# Patient Record
Sex: Male | Born: 1995
Health system: Southern US, Community
[De-identification: ages and names within clinical notes are randomized; demographics above are authoritative.]

## PROBLEM LIST (undated history)

## (undated) DIAGNOSIS — T7840XA Allergy, unspecified, initial encounter: Secondary | ICD-10-CM

## (undated) DIAGNOSIS — L309 Dermatitis, unspecified: Secondary | ICD-10-CM

## (undated) DIAGNOSIS — J45990 Exercise induced bronchospasm: Secondary | ICD-10-CM

## (undated) HISTORY — DX: Exercise induced bronchospasm: J45.990

## (undated) HISTORY — DX: Dermatitis, unspecified: L30.9

## (undated) HISTORY — DX: Allergy, unspecified, initial encounter: T78.40XA

---

## 2021-10-27 ENCOUNTER — Other Ambulatory Visit: Payer: Self-pay

## 2021-10-27 ENCOUNTER — Encounter: Payer: Self-pay | Admitting: Emergency Medicine

## 2021-10-27 ENCOUNTER — Ambulatory Visit
Admission: EM | Admit: 2021-10-27 | Discharge: 2021-10-27 | Disposition: A | Discharge status: Home / Self Care | Payer: 59 | Attending: Emergency Medicine | Admitting: Emergency Medicine

## 2021-10-27 ENCOUNTER — Ambulatory Visit: Admit: 2021-10-27 | Discharge status: Absent without leave | Payer: Self-pay

## 2021-10-27 DIAGNOSIS — J069 Acute upper respiratory infection, unspecified: Secondary | ICD-10-CM

## 2021-10-27 DIAGNOSIS — Z20822 Contact with and (suspected) exposure to covid-19: Secondary | ICD-10-CM | POA: Insufficient documentation

## 2021-10-27 DIAGNOSIS — R059 Cough, unspecified: Secondary | ICD-10-CM | POA: Diagnosis present

## 2021-10-27 DIAGNOSIS — R110 Nausea: Secondary | ICD-10-CM | POA: Diagnosis present

## 2021-10-27 LAB — RESP PANEL BY RT-PCR (FLU A&B, COVID) ARPGX2
Influenza A by PCR: NEGATIVE
Influenza B by PCR: NEGATIVE
SARS Coronavirus 2 by RT PCR: NEGATIVE

## 2021-10-27 MED ORDER — IPRATROPIUM BROMIDE 0.06 % NA SOLN: 2.0000 | Freq: Four times a day (QID) | NASAL | 12 refills | Status: DC

## 2021-10-27 MED ORDER — PROMETHAZINE-DM 6.25-15 MG/5ML PO SYRP: 5.0000 mL | ORAL_SOLUTION | Freq: Four times a day (QID) | ORAL | 0 refills | Status: DC | PRN

## 2021-10-27 MED ORDER — BENZONATATE 100 MG PO CAPS: 200.0000 mg | ORAL_CAPSULE | Freq: Three times a day (TID) | ORAL | 0 refills | Status: DC

## 2021-10-27 NOTE — Discharge Instructions (Addendum)
Use the Atrovent nasal spray, 2 squirts in each nostril every 6 hours, as needed for runny nose and postnasal drip.  Use the Tessalon Perles every 8 hours during the day.  Take them with a small sip of water.  They may give you some numbness to the base of your tongue or a metallic taste in your mouth, this is normal.  Use the Promethazine DM cough syrup at bedtime for cough and congestion.  It will make you drowsy so do not take it during the day.  Use over-the-counter Tylenol ibuprofen according the package instructions needed for fever or pain.  Return for reevaluation or see your primary care provider for any new or worsening symptoms.

## 2021-10-27 NOTE — ED Triage Notes (Signed)
Patient c/o bodyaches, cough, congestion, HAs, and nausea that started last night.  Patient also reports fevers.

## 2021-10-27 NOTE — ED Provider Notes (Signed)
[facility]    CSN: [id number] Arrival date & time: 10/27/21  1117      History   Chief Complaint Chief Complaint  Patient presents with   Cough   Generalized Body Aches    HPI Cody Walsh is a 25 y.o. male.   HPI  25 year old male here for evaluation of flulike symptoms.  Patient ports that he has been experiencing headache, body aches, nasal congestion postnasal drip, fever with a T-max of 100.4 this morning, cough that is mostly productive in the a.m. and clears throughout the day, and nausea.  He has not had any nasal discharge, sore throat, ear pain, shortness breath or wheezing, vomiting, or diarrhea.  History reviewed. No pertinent past medical history.  There are no problems to display for this patient.   History reviewed. No pertinent surgical history.     Home Medications    Prior to Admission medications   Medication Sig Start Date End Date Taking? Authorizing Provider  benzonatate (TESSALON) 100 MG capsule Take 2 capsules (200 mg total) by mouth every 8 (eight) hours. 10/27/21  Yes Becky Augusta, NP  ipratropium (ATROVENT) 0.06 % nasal spray Place 2 sprays into both nostrils 4 (four) times daily. 10/27/21  Yes Becky Augusta, NP  loratadine (CLARITIN) 10 MG tablet Take 10 mg by mouth daily.   Yes [provider]  promethazine-dextromethorphan (PROMETHAZINE-DM) 6.25-15 MG/5ML syrup Take 5 mLs by mouth 4 (four) times daily as needed. 10/27/21  Yes Becky Augusta, NP    Family History History reviewed. No pertinent family history.  Social History Social History   Tobacco Use   Smoking status: Never   Smokeless tobacco: Never  Vaping Use   Vaping Use: Never used  Substance Use Topics   Alcohol use: Yes   Drug use: Never     Allergies   Sulfamethoxazole-trimethoprim   Review of Systems Review of Systems  Constitutional:  Positive for fever. Negative for activity change and appetite change.  HENT:  Positive for congestion  and postnasal drip. Negative for ear pain, rhinorrhea and sore throat.   Respiratory:  Positive for cough. Negative for shortness of breath and wheezing.   Gastrointestinal:  Negative for diarrhea, nausea and vomiting.  Musculoskeletal:  Positive for arthralgias and myalgias.  Skin:  Negative for rash.  Neurological:  Positive for headaches.  Hematological: Negative.   Psychiatric/Behavioral: Negative.      Physical Exam Triage Vital Signs ED Triage Vitals  Enc Vitals Group     BP 10/27/21 1257 129/70     Pulse Rate 10/27/21 1257 79     Resp 10/27/21 1257 15     Temp 10/27/21 1257 98.3 F (36.8 C)     Temp Source 10/27/21 1257 Oral     SpO2 10/27/21 1257 100 %     Weight 10/27/21 1254 250 lb (113.4 kg)     Height 10/27/21 1254 6\' 6"  (1.981 m)     Head Circumference --      Peak Flow --      Pain Score 10/27/21 1253 3     Pain Loc --      Pain Edu? --      Excl. in GC? --    No data found.  Updated Vital Signs BP 129/70 (BP Location: Left Arm)    Pulse 79    Temp 98.3 F (36.8 C) (Oral)    Resp 15    Ht 6\' 6"  (1.981 m)    Wt 250 lb (113.4  kg)    SpO2 100%    BMI 28.89 kg/m   Visual Acuity Right Eye Distance:   Left Eye Distance:   Bilateral Distance:    Right Eye Near:   Left Eye Near:    Bilateral Near:     Physical Exam   UC Treatments / Results  Labs (all labs ordered are listed, but only abnormal results are displayed) Labs Reviewed  RESP PANEL BY RT-PCR (FLU A&B, COVID) ARPGX2    EKG   Radiology No results found.  Procedures Procedures (including critical care time)  Medications Ordered in UC Medications - No data to display  Initial Impression / Assessment and Plan / UC Course  I have reviewed the triage vital signs and the nursing notes.  Pertinent labs & imaging results that were available during my care of the patient were reviewed by me and considered in my medical decision making (see chart for details).  Is a nontoxic-appearing  25 year old male here for evaluation of flulike symptoms that began yesterday as outlined in the HPI above.  Patient is here with his significant other who reports that he has actually been experiencing a cough for couple of weeks as he has had successive upper respiratory infections.  He states his cough is only productive in the morning and then gets dry as the day goes on.  His temperature today maxed out at 100.4.  On physical exam he has pearly-gray tympanic membranes bilaterally with normal light reflex and clear external auditory canals.  Nasal mucosa is erythematous and edematous with clear discharge in both nares.  Oropharyngeal exam is benign.  No cervical lymphadenopathy appreciated exam.  Cardiopulmonary exam reveals clear lung sounds in all fields.  Patient swabbed for respiratory triplex panel at triage and it is pending.  Respiratory triplex panel was negative for influenza and COVID.  Will discharge patient home with a diagnosis of upper respiratory infection with a cough.  Most likely viral.  We will treat with Atrovent nasal spray, Tessalon Perles, and Promethazine DM cough syrup.  Patient can continue to use Tylenol and ibuprofen as needed for fever and body aches.   Final Clinical Impressions(s) / UC Diagnoses   Final diagnoses:  Viral URI with cough     Discharge Instructions      Use the Atrovent nasal spray, 2 squirts in each nostril every 6 hours, as needed for runny nose and postnasal drip.  Use the Tessalon Perles every 8 hours during the day.  Take them with a small sip of water.  They may give you some numbness to the base of your tongue or a metallic taste in your mouth, this is normal.  Use the Promethazine DM cough syrup at bedtime for cough and congestion.  It will make you drowsy so do not take it during the day.  Use over-the-counter Tylenol ibuprofen according the package instructions needed for fever or pain.  Return for reevaluation or see your primary  care provider for any new or worsening symptoms.      ED Prescriptions     Medication Sig Dispense Auth. Provider   benzonatate (TESSALON) 100 MG capsule Take 2 capsules (200 mg total) by mouth every 8 (eight) hours. 21 capsule Becky Augusta, NP   ipratropium (ATROVENT) 0.06 % nasal spray Place 2 sprays into both nostrils 4 (four) times daily. 15 mL Becky Augusta, NP   promethazine-dextromethorphan (PROMETHAZINE-DM) 6.25-15 MG/5ML syrup Take 5 mLs by mouth 4 (four) times daily as needed. 118 mL Alycia Rossetti,  Riki Rusk, NP      PDMP not reviewed this encounter.   Becky Augusta, NP 10/27/21 1349

## 2021-11-01 ENCOUNTER — Other Ambulatory Visit: Payer: Self-pay

## 2021-11-01 ENCOUNTER — Emergency Department: Payer: 59

## 2021-11-01 ENCOUNTER — Emergency Department
Admission: EM | Admit: 2021-11-01 | Discharge: 2021-11-01 | Disposition: A | Discharge status: Home / Self Care | Payer: 59 | Attending: Emergency Medicine | Admitting: Emergency Medicine

## 2021-11-01 DIAGNOSIS — R0600 Dyspnea, unspecified: Secondary | ICD-10-CM | POA: Diagnosis not present

## 2021-11-01 DIAGNOSIS — R079 Chest pain, unspecified: Secondary | ICD-10-CM | POA: Diagnosis not present

## 2021-11-01 DIAGNOSIS — Z20822 Contact with and (suspected) exposure to covid-19: Secondary | ICD-10-CM | POA: Insufficient documentation

## 2021-11-01 DIAGNOSIS — R197 Diarrhea, unspecified: Secondary | ICD-10-CM | POA: Diagnosis not present

## 2021-11-01 DIAGNOSIS — R059 Cough, unspecified: Secondary | ICD-10-CM | POA: Diagnosis not present

## 2021-11-01 DIAGNOSIS — R0981 Nasal congestion: Secondary | ICD-10-CM | POA: Diagnosis not present

## 2021-11-01 DIAGNOSIS — R519 Headache, unspecified: Secondary | ICD-10-CM | POA: Diagnosis not present

## 2021-11-01 DIAGNOSIS — R630 Anorexia: Secondary | ICD-10-CM | POA: Insufficient documentation

## 2021-11-01 DIAGNOSIS — R748 Abnormal levels of other serum enzymes: Secondary | ICD-10-CM

## 2021-11-01 DIAGNOSIS — R509 Fever, unspecified: Secondary | ICD-10-CM | POA: Diagnosis present

## 2021-11-01 DIAGNOSIS — M791 Myalgia, unspecified site: Secondary | ICD-10-CM | POA: Diagnosis not present

## 2021-11-01 DIAGNOSIS — R945 Abnormal results of liver function studies: Secondary | ICD-10-CM | POA: Diagnosis not present

## 2021-11-01 LAB — COMPREHENSIVE METABOLIC PANEL
ALT: 206 U/L — ABNORMAL HIGH (ref 0–44)
AST: 132 U/L — ABNORMAL HIGH (ref 15–41)
Albumin: 3.7 g/dL (ref 3.5–5.0)
Alkaline Phosphatase: 103 U/L (ref 38–126)
Anion gap: 8 (ref 5–15)
BUN: 9 mg/dL (ref 6–20)
CO2: 23 mmol/L (ref 22–32)
Calcium: 8.3 mg/dL — ABNORMAL LOW (ref 8.9–10.3)
Chloride: 102 mmol/L (ref 98–111)
Creatinine, Ser: 0.71 mg/dL (ref 0.61–1.24)
GFR, Estimated: >60 mL/min (ref >60)
Glucose, Bld: 117 mg/dL — ABNORMAL HIGH (ref 70–99)
Potassium: 3.9 mmol/L (ref 3.5–5.1)
Sodium: 133 mmol/L — ABNORMAL LOW (ref 135–145)
Total Bilirubin: 0.8 mg/dL (ref 0.3–1.2)
Total Protein: 7.6 g/dL (ref 6.5–8.1)

## 2021-11-01 LAB — CBC WITH DIFFERENTIAL/PLATELET
Abs Immature Granulocytes: 0.12 K/uL — ABNORMAL HIGH (ref 0.00–0.07)
Basophils Absolute: 0.1 K/uL (ref 0.0–0.1)
Basophils Relative: 1 %
Eosinophils Absolute: 0 K/uL (ref 0.0–0.5)
Eosinophils Relative: 0 %
HCT: 41.5 % (ref 39.0–52.0)
Hemoglobin: 14 g/dL (ref 13.0–17.0)
Immature Granulocytes: 1 %
Lymphocytes Relative: 72 %
Lymphs Abs: 6.5 K/uL — ABNORMAL HIGH (ref 0.7–4.0)
MCH: 29.2 pg (ref 26.0–34.0)
MCHC: 33.7 g/dL (ref 30.0–36.0)
MCV: 86.6 fL (ref 80.0–100.0)
Monocytes Absolute: 0.5 K/uL (ref 0.1–1.0)
Monocytes Relative: 5 %
Neutro Abs: 1.9 K/uL (ref 1.7–7.7)
Neutrophils Relative %: 21 %
Platelets: 196 K/uL (ref 150–400)
RBC: 4.79 MIL/uL (ref 4.22–5.81)
RDW: 13.4 % (ref 11.5–15.5)
Smear Review: NORMAL
WBC Morphology: ABNORMAL
WBC: 9.1 K/uL (ref 4.0–10.5)
nRBC: 0 % (ref 0.0–0.2)

## 2021-11-01 LAB — RESP PANEL BY RT-PCR (FLU A&B, COVID) ARPGX2
Influenza A by PCR: NEGATIVE
Influenza B by PCR: NEGATIVE
SARS Coronavirus 2 by RT PCR: NEGATIVE

## 2021-11-01 LAB — LACTIC ACID, PLASMA
Lactic Acid, Venous: 1.5 mmol/L (ref 0.5–1.9)
Lactic Acid, Venous: 1.7 mmol/L (ref 0.5–1.9)

## 2021-11-01 LAB — MONONUCLEOSIS SCREEN: Mono Screen: POSITIVE — AB

## 2021-11-01 LAB — PATHOLOGIST SMEAR REVIEW

## 2021-11-01 MED ORDER — KETOROLAC TROMETHAMINE 30 MG/ML IJ SOLN
15.0000 mg | Freq: Once | INTRAMUSCULAR | Status: AC
  Administered 2021-11-01: 15 mg via INTRAMUSCULAR
  Filled 2021-11-01: qty 1

## 2021-11-01 NOTE — Discharge Instructions (Signed)
Your blood work was overall reassuring, your lymphocytes were mildly elevated as were your liver function tests which is often seen with viral illness.  I would like you to have your liver function test repeated in about 1 week.  If you are having worsening headache or developing other new symptoms that are concerning to you please return to the emergency department.  We have also sent a blood culture, you will receive a phone call if this were to return positive.  Please continue to try to stay hydrated.  Please avoid using Tylenol until your liver function tests are rechecked.  You can take Motrin for fever and pain.

## 2021-11-01 NOTE — ED Notes (Signed)
See triage note   presents with low grade fever and cough  sxs' started couple of days

## 2021-11-01 NOTE — ED Triage Notes (Signed)
Pt c/o fever for one week, heart palpitations, dizziness with standing, cough.

## 2021-11-01 NOTE — ED Provider Notes (Addendum)
[facility] Provider Note    Event Date/Time   First MD Initiated Contact with Patient 11/01/21 1203     (approximate)   History   Fever   HPI  Cody Walsh is a 26 y.o. male with no significant past medical history presents with fever x1 week.  Started about 1 week ago last Friday.  He has had daily fever with T-max of 102.9.  Other symptoms include myalgias cough congestion nausea poor p.o. intake and headache.  Headache is been relatively constant, improves with medication, not worse in the morning.  Does note that his vision is somewhat blurry today but otherwise denies other neurologic symptoms.  Denies neck pain.  No sick contacts.  Denies abdominal pain.  Did have diarrhea this morning.  No recent travel no history of abnormal heart valves or recent dental procedures, no history of IV drug use.  Patient does endorse some dyspnea and chest pain with coughing.    History reviewed. No pertinent past medical history.  There are no problems to display for this patient.    Physical Exam  Triage Vital Signs: ED Triage Vitals [11/01/21 1010]  Enc Vitals Group     BP 137/88     Pulse Rate (!) 107     Resp 20     Temp 99.1 F (37.3 C)     Temp Source Oral     SpO2 97 %     Weight 250 lb (113.4 kg)     Height 6\' 6"  (1.981 m)     Head Circumference      Peak Flow      Pain Score 5     Pain Loc      Pain Edu?      Excl. in GC?     Most recent vital signs: Vitals:   11/01/21 1010  BP: 137/88  Pulse: (!) 107  Resp: 20  Temp: 99.1 F (37.3 C)  SpO2: 97%     General: Awake, no distress.  Appears very well CV:  Good peripheral perfusion.  Resp:  Normal effort.  Abd:  No distention.  Neuro:             Awake, Alert, Oriented x 3 Aox3, nml speech  PERRL, EOMI, face symmetric, nml tongue movement   Other:  No meningismus    ED Results / Procedures / Treatments  Labs (all labs ordered are listed, but only abnormal results are  displayed) Labs Reviewed  COMPREHENSIVE METABOLIC PANEL - Abnormal; Notable for the following components:      Result Value   Sodium 133 (*)    Glucose, Bld 117 (*)    Calcium 8.3 (*)    AST 132 (*)    ALT 206 (*)    All other components within normal limits  CBC WITH DIFFERENTIAL/PLATELET - Abnormal; Notable for the following components:   Lymphs Abs 6.5 (*)    Abs Immature Granulocytes 0.12 (*)    All other components within normal limits  RESP PANEL BY RT-PCR (FLU A&B, COVID) ARPGX2  CULTURE, BLOOD (ROUTINE X 2)  CULTURE, BLOOD (ROUTINE X 2)  LACTIC ACID, PLASMA  PATHOLOGIST SMEAR REVIEW  LACTIC ACID, PLASMA  URINALYSIS, ROUTINE W REFLEX MICROSCOPIC  MONONUCLEOSIS SCREEN     EKG  Reviewed by myself, sinus tachycardia, normal axis, normal intervals, no acute ischemic changes   RADIOLOGY I reviewed the CXR which does not show any acute cardiopulmonary process; agree with radiology report  PROCEDURES:  Critical Care performed: No  Procedures  The patient is on the cardiac monitor to evaluate for evidence of arrhythmia and/or significant heart rate changes.   MEDICATIONS ORDERED IN ED: Medications  ketorolac (TORADOL) 30 MG/ML injection 15 mg (15 mg Intramuscular Given 11/01/21 1312)     IMPRESSION / MDM / ASSESSMENT AND PLAN / ED COURSE  I reviewed the triage vital signs and the nursing notes.                              Differential diagnosis includes, but is not limited to, viral syndrome, bacterial infection, pneumonia, viral meningitis, bacteremia  Patient is a 26 year old previously healthy male who presents with 1 week of fever.  Has been taking his temperature orally at home and pretty consistently has had fever above 100 with a T-max of 102.9.  He has a multitude of other symptoms including dry cough congestion dyspnea myalgias diarrhea and headache which all suggest to me that this is likely a viral process.  He has no abdominal pain and has had  several episodes of emesis and an episode of diarrhea today, no blood in his stool.  In terms of his headache this is been fairly consistent and has had some blurred vision today but otherwise no neurologic symptoms no neck pain.  He has a temp of 99.1 here orally, heart rate mildly elevated at 107 otherwise vital signs are within normal limits.  On exam patient appears very well.  He has clear lungs benign abdominal exam does not appear dehydrated.  Neurologic exam is also reassuring and he has no meningismus.  Basic labs were obtained including a CBC which is no leukocytosis, does have some lymphocytosis and blood smear was reviewed by the pathologist which showed a reactive lymphocytosis with a left shift and per the pathologist report this is consistent with a reactive process and likely viral infection.  His lactate is normal.  His CMP is notable for mildly elevated AST and ALT.  Again he has no abdominal tenderness.  I suspect that this is in the setting of viral illness.  We will send an EBV Monospot.  Discussed results with the patient and I emphasized the importance of follow-up in about 1 week for repeat AST and ALT testing.  We also discussed return precautions for any new or worsening symptoms.  He does not have a primary care provider yet, did refer to [facility] clinic and advised that if he cannot be seen by primary doctor that he return to the ED for evaluation.  He is otherwise well-appearing, tolerating p.o. I do feel that he is appropriate for outpatient management.   After discharge, patient's Monospot does turn positive.  I think this explains his symptomatology and duration of illness. Clinical Course as of 11/01/21 Darlina Guys Nov 01, 2021  1935 Mono Screen(!): POSITIVE [KM]    Clinical Course User Index [KM] Georga Hacking, MD     FINAL CLINICAL IMPRESSION(S) / ED DIAGNOSES   Final diagnoses:  Fever, unspecified fever cause  Elevated liver enzymes     Rx / DC Orders    ED Discharge Orders     None        Note:  This document was prepared using Dragon voice recognition software and may include unintentional dictation errors.   Georga Hacking, MD 11/01/21 1321    Georga Hacking, MD 11/01/21 [id number]

## 2021-11-06 ENCOUNTER — Other Ambulatory Visit: Payer: Self-pay

## 2021-11-06 ENCOUNTER — Ambulatory Visit
Admission: EM | Admit: 2021-11-06 | Discharge: 2021-11-06 | Disposition: A | Discharge status: Home / Self Care | Payer: 59 | Attending: Emergency Medicine | Admitting: Emergency Medicine

## 2021-11-06 DIAGNOSIS — B2790 Infectious mononucleosis, unspecified without complication: Secondary | ICD-10-CM | POA: Diagnosis not present

## 2021-11-06 LAB — CULTURE, BLOOD (ROUTINE X 2)
Culture: NO GROWTH
Culture: NO GROWTH

## 2021-11-06 MED ORDER — LIDOCAINE VISCOUS HCL 2 % MT SOLN: 15.0000 mL | OROMUCOSAL | 0 refills | Status: DC | PRN

## 2021-11-06 NOTE — ED Provider Notes (Signed)
[facility]    CSN: [id number] Arrival date & time: 11/06/21  [id number]      History   Chief Complaint Chief Complaint  Patient presents with   Sore Throat    HPI Cody Walsh is a 26 y.o. male.   HPI  26 year old male here for evaluation of sore throat.  Patient reports that he has been sick for the last 10 days with URI symptoms.  He was diagnosed with a viral URI in this urgent care on 10/27/2021.  He was evaluated on 11[facility]01/20[facility] ER where he tested positive for mononucleosis.  His prime reason for being here is that his throat is feeling swollen and he is having difficulty swallowing secondary to the pain.  He has tried warm salt water gargles but he is unsure if he had the right consistency as it caused him to vomit.  Patient has been using ibuprofen for pain which has been helping.  History reviewed. No pertinent past medical history.  There are no problems to display for this patient.   History reviewed. No pertinent surgical history.     Home Medications    Prior to Admission medications   Medication Sig Start Date End Date Taking? Authorizing Provider  ipratropium (ATROVENT) 0.06 % nasal spray Place 2 sprays into both nostrils 4 (four) times daily. 10/27/21  Yes Becky Augustayan, Kriston Mckinnie, NP  lidocaine (XYLOCAINE) 2 % solution Use as directed 15 mLs in the mouth or throat as needed for mouth pain. Take 15-20 minutes before meals and at bedtime (QID). 11/06/21  Yes Becky Augustayan, Kai Calico, NP  loratadine (CLARITIN) 10 MG tablet Take 10 mg by mouth daily.   Yes [provider]  promethazine-dextromethorphan (PROMETHAZINE-DM) 6.25-15 MG/5ML syrup Take 5 mLs by mouth 4 (four) times daily as needed. 10/27/21  Yes Becky Augustayan, Beren Yniguez, NP    Family History History reviewed. No pertinent family history.  Social History Social History   Tobacco Use   Smoking status: Never   Smokeless tobacco: Never  Vaping Use   Vaping Use: Never used  Substance Use Topics   Alcohol  use: Not Currently   Drug use: Never     Allergies   Sulfamethoxazole-trimethoprim   Review of Systems Review of Systems  Constitutional:  Positive for fatigue and fever.  HENT:  Positive for sore throat.     Physical Exam Triage Vital Signs ED Triage Vitals  Enc Vitals Group     BP 11/06/21 0823 125/66     Pulse Rate 11/06/21 0823 (!) 59     Resp 11/06/21 0823 18     Temp 11/06/21 0823 (!) 101.8 F (38.8 C)     Temp Source 11/06/21 0823 Oral     SpO2 11/06/21 0823 99 %     Weight 11/06/21 0820 255 lb (115.7 kg)     Height 11/06/21 0820 6\' 6"  (1.981 m)     Head Circumference --      Peak Flow --      Pain Score 11/06/21 0820 9     Pain Loc --      Pain Edu? --      Excl. in GC? --    No data found.  Updated Vital Signs BP 125/66 (BP Location: Left Arm)    Pulse (!) 59    Temp (!) 101.8 F (38.8 C) (Oral)    Resp 18    Ht 6\' 6"  (1.981 m)    Wt 255 lb (115.7 kg)    SpO2 99%  BMI 29.47 kg/m   Visual Acuity Right Eye Distance:   Left Eye Distance:   Bilateral Distance:    Right Eye Near:   Left Eye Near:    Bilateral Near:     Physical Exam Vitals and nursing note reviewed.  Constitutional:      Appearance: He is normal weight. He is ill-appearing.  HENT:     Head: Normocephalic and atraumatic.     Mouth/Throat:     Mouth: Mucous membranes are moist.     Pharynx: Oropharyngeal exudate and posterior oropharyngeal erythema present.  Cardiovascular:     Rate and Rhythm: Normal rate and regular rhythm.     Pulses: Normal pulses.     Heart sounds: No murmur heard.   No friction rub. No gallop.  Pulmonary:     Effort: Pulmonary effort is normal.     Breath sounds: Normal breath sounds. No stridor. No wheezing, rhonchi or rales.  Musculoskeletal:     Cervical back: Normal range of motion and neck supple.  Lymphadenopathy:     Cervical: Cervical adenopathy present.  Skin:    General: Skin is warm and dry.     Capillary Refill: Capillary refill takes  less than 2 seconds.     Coloration: Skin is not jaundiced.     Findings: No erythema or rash.  Neurological:     General: No focal deficit present.     Mental Status: He is alert and oriented to person, place, and time.  Psychiatric:        Mood and Affect: Mood normal.        Behavior: Behavior normal.        Thought Content: Thought content normal.        Judgment: Judgment normal.     UC Treatments / Results  Labs (all labs ordered are listed, but only abnormal results are displayed) Labs Reviewed - No data to display  EKG   Radiology No results found.  Procedures Procedures (including critical care time)  Medications Ordered in UC Medications - No data to display  Initial Impression / Assessment and Plan / UC Course  I have reviewed the triage vital signs and the nursing notes.  Pertinent labs & imaging results that were available during my care of the patient were reviewed by me and considered in my medical decision making (see chart for details).  26 year old male here for evaluation of sore throat and difficulty swallowing that is been ongoing for the past 10 days.  He was diagnosed with mononucleosis 5 days ago at [facility] but indicates that he was not made aware that he had mono.  He does report that he was advised not to take Tylenol for his fever and body aches only ibuprofen but he was not sure why.  Patient had elevated liver enzymes at that time which showed an AST of 132 and an ALT of 206.  He has been taking ibuprofen which has been up of the pain and he also took loratadine this morning thinking it might be allergy mediated.  Patient's physical exam reveals 2+ tonsillar edema bilaterally with white exudate.  Patient also has anterior cervical of adenopathy on exam.  No stridor when auscultating over the trachea.  No scleral icterus and no jaundice of the skin.  Lung sounds are clear to auscultation all fields.  Patient exam is consistent with infectious  mononucleosis.  I advised him that it will take time for his body to heal and that it can  take 4 to 6 weeks for the infectious mono to completely resolve.  I discussed with him that if he develops any yellowing of his eyes, skin, or darkening of his urine that he needs to return for reevaluation, either to [place] or to the emergency department.  He is to not take acetaminophen for the fever and only use ibuprofen.  I will give him some viscous lidocaine to help with his throat pain that he can take before meals and at bedtime.  I also advised him to continue the salt water gargles and I gave him an appropriate ratio to use on his discharge instructions.  Chloraseptic and Sucrets lozenges are also acceptable means of controlling the sore throat pain.  I further emphasized that he is at risk for splenic rupture anywhere between day 2 and day 21 of symptom onset due to white blood cell congestion and that if he develops any pain in his left upper quadrant of his abdomen he needs to be evaluated immediately.  He is also to avoid any contact sports as getting struck in the abdomen can cause organ rupture.  Patient verbalizes an understanding of same.  I have given him a work and school note to cover him for the next 2 days.  I told him to touch base with his PCP as if he needs to be out of work longer than that he may need FMLA paperwork and they will have to fill that out.   Final Clinical Impressions(s) / UC Diagnoses   Final diagnoses:  Infectious mononucleosis without complication, infectious mononucleosis due to unspecified organism     Discharge Instructions      Use the viscous lidocaine 15-20 meals and at bedtime for throat pain.   Gargle with warm salt water 2-3 times a day to soothe your throat, aid in pain relief, and aid in healing. 1 tablespoon table salt to 8 ounces warm water.  Take over-the-counter ibuprofen according to the package instructions as needed for pain.  You can also use  Chloraseptic or Sucrets lozenges, 1 lozenge every 2 hours as needed for throat pain.  If you develop any new or worsening symptoms return for reevaluation.  If you develop yellowing of the skin or eyes you need to return for re-evaluation or go to the ER.   There is a risk of both spontaneous and active rupture of your spleen which can occur 2 to 21 days following presentation of symptoms.  If you develop any severe pain in the left upper quadrant of your abdomen you need to go to the ER immediately.  For this reason you also need to avoid any strenuous activity or contact sports to prevent the rupture of your spleen.  Take over-the-counter Tylenol and ibuprofen according the package instructions as needed for fever and pain.  Adequate hydration is essential to helping your body fight the infection.  Please make sure you are drinking at least 64 ounces of fluid a day.  If you have any worsening of your rash, abdominal pain, nausea, vomiting, yellowing of your skin or eyes you need to go to the ER for evaluation.      ED Prescriptions     Medication Sig Dispense Auth. Provider   lidocaine (XYLOCAINE) 2 % solution Use as directed 15 mLs in the mouth or throat as needed for mouth pain. Take 15-20 minutes before meals and at bedtime (QID). 100 mL Becky Augusta, NP      PDMP not reviewed this encounter.  Becky Augustayan, Nitin Mckowen, NP 11/06/21 [phone]

## 2021-11-06 NOTE — Discharge Instructions (Addendum)
Use the viscous lidocaine 15-20 meals and at bedtime for throat pain.   Gargle with warm salt water 2-3 times a day to soothe your throat, aid in pain relief, and aid in healing. 1 tablespoon table salt to 8 ounces warm water.  Take over-the-counter ibuprofen according to the package instructions as needed for pain.  You can also use Chloraseptic or Sucrets lozenges, 1 lozenge every 2 hours as needed for throat pain.  If you develop any new or worsening symptoms return for reevaluation.  If you develop yellowing of the skin or eyes you need to return for re-evaluation or go to the ER.   There is a risk of both spontaneous and active rupture of your spleen which can occur 2 to 21 days following presentation of symptoms.  If you develop any severe pain in the left upper quadrant of your abdomen you need to go to the ER immediately.  For this reason you also need to avoid any strenuous activity or contact sports to prevent the rupture of your spleen.  Take over-the-counter Tylenol and ibuprofen according the package instructions as needed for fever and pain.  Adequate hydration is essential to helping your body fight the infection.  Please make sure you are drinking at least 64 ounces of fluid a day.  If you have any worsening of your rash, abdominal pain, nausea, vomiting, yellowing of your skin or eyes you need to go to the ER for evaluation.

## 2021-11-06 NOTE — ED Triage Notes (Signed)
Pt states that his throat is swollen and he has difficulty swallowing x3days. Pt has tried OTC allergy medication and gargling with salt water.   Pt states that he has been sick for the last 10 days with a URI and was seen at [facility] urgent care.   Pt took Loratadine this morning at 7:30am.

## 2021-12-21 ENCOUNTER — Ambulatory Visit: Payer: 59 | Admitting: Family Medicine

## 2021-12-21 ENCOUNTER — Other Ambulatory Visit: Payer: Self-pay

## 2021-12-21 ENCOUNTER — Encounter: Payer: Self-pay | Admitting: Family Medicine

## 2021-12-21 VITALS — BP 130/78 | HR 80 | Ht 78.0 in | Wt 283.0 lb

## 2021-12-21 DIAGNOSIS — Z1159 Encounter for screening for other viral diseases: Secondary | ICD-10-CM | POA: Diagnosis not present

## 2021-12-21 DIAGNOSIS — Z0000 Encounter for general adult medical examination without abnormal findings: Secondary | ICD-10-CM | POA: Diagnosis not present

## 2021-12-21 DIAGNOSIS — L309 Dermatitis, unspecified: Secondary | ICD-10-CM | POA: Insufficient documentation

## 2021-12-21 DIAGNOSIS — Z114 Encounter for screening for human immunodeficiency virus [HIV]: Secondary | ICD-10-CM | POA: Diagnosis not present

## 2021-12-21 DIAGNOSIS — Z23 Encounter for immunization: Secondary | ICD-10-CM | POA: Diagnosis not present

## 2021-12-21 DIAGNOSIS — R7989 Other specified abnormal findings of blood chemistry: Secondary | ICD-10-CM

## 2021-12-21 DIAGNOSIS — Z13220 Encounter for screening for lipoid disorders: Secondary | ICD-10-CM

## 2021-12-21 NOTE — Progress Notes (Signed)
Annual Physical Exam Visit  Patient Information:  Patient ID: Cody Walsh, male DOB: 15-Dec-1995 Age: 26 y.o. MRN: [id number]   Subjective:   CC: Annual Physical Exam  HPI:  Cody Walsh is here for their annual physical.  I reviewed the past medical history, family history, social history, surgical history, and allergies today and changes were made as necessary.  Please see the problem list section below for additional details.  Past Medical History: Past Medical History:  Diagnosis Date   Allergy    Eczema    Exercise-induced asthma    resolved   Past Surgical History: History reviewed. No pertinent surgical history. Family History: Family History  Problem Relation Age of Onset   Liver disease Father        NASH   Atrial fibrillation Maternal Grandmother    Lupus Maternal Grandmother    Breast cancer Paternal Grandmother    Lupus Paternal Grandmother    Diabetes Paternal Grandfather    Pancreatic cancer Paternal Grandfather    Allergies: Allergies  Allergen Reactions   Other Other (See Comments)    Dust mites   Sulfamethoxazole-Trimethoprim Hives        Nickel Rash   Health Maintenance: Health Maintenance  Topic Date Due   Hepatitis C Screening  Never done   TETANUS/TDAP  09/26/2018   COVID-19 Vaccine (3 - Booster for Pfizer series) 04/06/2020   INFLUENZA VACCINE  Completed   HIV Screening  Completed   HPV VACCINES  Aged Out    HM Colonoscopy     This patient has no relevant Health Maintenance data.      Medications: Current Outpatient Medications on File Prior to Visit  Medication Sig Dispense Refill   loratadine (CLARITIN) 10 MG tablet Take 10 mg by mouth daily.     No current facility-administered medications on file prior to visit.    Review of Systems: No headache, visual changes, nausea, vomiting, diarrhea, constipation, dizziness, abdominal pain, +skin rash, fevers, chills, night sweats, swollen lymph nodes, weight loss, chest  pain, body aches, joint swelling, muscle aches, shortness of breath, mood changes, visual or auditory hallucinations reported.  Objective:   Vitals:   12/21/21 1457  BP: 130/78  Pulse: 80  SpO2: 98%   Vitals:   12/21/21 1457  Weight: 283 lb (128.4 kg)  Height: 6\' 6"  (1.981 m)   Body mass index is 32.7 kg/m.  General: Well Developed, well nourished, and in no acute distress.  Neuro: Alert and oriented x3, extra-ocular muscles intact, sensation grossly intact. Cranial nerves II through XII are grossly intact, motor, sensory, and coordinative functions are intact. HEENT: Normocephalic, atraumatic, pupils equal round reactive to light, neck supple, no masses, no lymphadenopathy, thyroid nonpalpable. Oropharynx, nasopharynx, external ear canals left greater than right mild flaking/dry skin, otherwise are unremarkable. Skin: Warm and dry, no rashes noted.  Eczematous, faintly erythematous lesion at the left greater than right elbow flexion Cardiac: Regular rate and rhythm, no murmurs rubs or gallops. No peripheral edema. Pulses symmetric. Respiratory: Clear to auscultation bilaterally. Not using accessory muscles, speaking in full sentences.  Abdominal: Soft, minimally tender without rebound at the right lower and left lower quadrants, nondistended, positive bowel sounds, no masses, no organomegaly. Musculoskeletal: Shoulder, elbow, wrist, hip, knee, ankle stable, and with full range of motion.  Impression and Recommendations:   The patient was counselled, risk factors were discussed, and anticipatory guidance given.  Eczema Very well controlled with intermittent flares, most recently noted after recovering from  infectious mononucleosis.  He has been treating this with OTC topical agents with great response.  I have offered further evaluation by dermatology if symptoms progress or fail to adequately respond to current treatments.  Annual physical exam Annual examination completed, risk  stratification labs ordered, anticipatory guidance provided.  We will follow labs once resulted.  Patient received annual Influenza vaccination today.  Orders & Medications Medications: No orders of the defined types were placed in this encounter.  Orders Placed This Encounter  Procedures   Flu Vaccine QUAD 11mo+IM (Fluarix, Fluzone & Alfiuria Quad PF)   Apo A1 + B + Ratio   CBC   Comprehensive metabolic panel   Hepatitis C antibody   HIV Antibody (routine testing w rflx)   Lipid panel   TSH   VITAMIN D 25 Hydroxy (Vit-D Deficiency, Fractures)     Return in about 1 year (around 12/21/2022) for annual physical.    Jerrol Banana, MD   Primary Care Sports Medicine [facility]

## 2021-12-21 NOTE — Assessment & Plan Note (Addendum)
Annual examination completed, risk stratification labs ordered, anticipatory guidance provided.  We will follow labs once resulted.  Patient received annual Influenza vaccination today.

## 2021-12-21 NOTE — Patient Instructions (Signed)
-   Obtain fasting labs with orders provided (can have water or black coffee but otherwise no food or drink x 8 hours before labs) °- Review information provided °- Attend eye doctor annually, dentist every 6 months, work towards or maintain 30 minutes of moderate intensity physical activity at least 5 days per week, and consume a balanced diet °- Return in 1 year for physical °- Contact us for any questions between now and then °

## 2021-12-21 NOTE — Assessment & Plan Note (Signed)
Very well controlled with intermittent flares, most recently noted after recovering from infectious mononucleosis.  He has been treating this with OTC topical agents with great response.  I have offered further evaluation by dermatology if symptoms progress or fail to adequately respond to current treatments.

## 2021-12-26 ENCOUNTER — Other Ambulatory Visit: Payer: Self-pay | Admitting: Family Medicine

## 2021-12-26 DIAGNOSIS — R7989 Other specified abnormal findings of blood chemistry: Secondary | ICD-10-CM

## 2021-12-26 LAB — COMPREHENSIVE METABOLIC PANEL
ALT: 33 IU/L (ref 0–44)
AST: 27 IU/L (ref 0–40)
Albumin/Globulin Ratio: 2.1 (ref 1.2–2.2)
Albumin: 4.9 g/dL (ref 4.1–5.2)
Alkaline Phosphatase: 73 IU/L (ref 44–121)
BUN/Creatinine Ratio: 13 (ref 9–20)
BUN: 12 mg/dL (ref 6–20)
Bilirubin Total: 0.5 mg/dL (ref 0.0–1.2)
CO2: 23 mmol/L (ref 20–29)
Calcium: 9.2 mg/dL (ref 8.7–10.2)
Chloride: 105 mmol/L (ref 96–106)
Creatinine, Ser: 0.91 mg/dL (ref 0.76–1.27)
Globulin, Total: 2.3 g/dL (ref 1.5–4.5)
Glucose: 95 mg/dL (ref 70–99)
Potassium: 4.7 mmol/L (ref 3.5–5.2)
Sodium: 141 mmol/L (ref 134–144)
Total Protein: 7.2 g/dL (ref 6.0–8.5)
eGFR: 118 mL/min/{1.73_m2} (ref >59)

## 2021-12-26 LAB — LIPID PANEL
Chol/HDL Ratio: 4.3 ratio (ref 0.0–5.0)
Cholesterol, Total: 151 mg/dL (ref 100–199)
HDL: 35 mg/dL — ABNORMAL LOW (ref >39)
LDL Chol Calc (NIH): 100 mg/dL — ABNORMAL HIGH (ref 0–99)
Triglycerides: 84 mg/dL (ref 0–149)
VLDL Cholesterol Cal: 16 mg/dL (ref 5–40)

## 2021-12-26 LAB — CBC
Hematocrit: 44.6 % (ref 37.5–51.0)
Hemoglobin: 14.8 g/dL (ref 13.0–17.7)
MCH: 28.5 pg (ref 26.6–33.0)
MCHC: 33.2 g/dL (ref 31.5–35.7)
MCV: 86 fL (ref 79–97)
Platelets: 282 10*3/uL (ref 150–450)
RBC: 5.19 x10E6/uL (ref 4.14–5.80)
RDW: 13.5 % (ref 11.6–15.4)
WBC: 6.4 10*3/uL (ref 3.4–10.8)

## 2021-12-26 LAB — VITAMIN D 25 HYDROXY (VIT D DEFICIENCY, FRACTURES): Vit D, 25-Hydroxy: 26.8 ng/mL — ABNORMAL LOW (ref 30.0–100.0)

## 2021-12-26 LAB — TSH: TSH: 1.61 u[IU]/mL (ref 0.450–4.500)

## 2021-12-26 LAB — APO A1 + B + RATIO
Apolipo. B/A-1 Ratio: 0.8 ratio — ABNORMAL HIGH (ref 0.0–0.7)
Apolipoprotein A-1: 100 mg/dL — ABNORMAL LOW (ref 101–178)
Apolipoprotein B: 83 mg/dL (ref <90)

## 2021-12-26 LAB — HIV ANTIBODY (ROUTINE TESTING W REFLEX): HIV Screen 4th Generation wRfx: NONREACTIVE

## 2021-12-26 LAB — HEPATITIS C ANTIBODY: Hep C Virus Ab: NONREACTIVE

## 2021-12-26 MED ORDER — VITAMIN D (ERGOCALCIFEROL) 1.25 MG (50000 UNIT) PO CAPS: 50000.0000 [IU] | ORAL_CAPSULE | ORAL | 0 refills | Status: DC

## 2022-02-07 ENCOUNTER — Encounter: Payer: Self-pay | Admitting: Family Medicine

## 2022-02-07 ENCOUNTER — Ambulatory Visit: Payer: 59 | Admitting: Family Medicine

## 2022-02-07 VITALS — BP 128/70 | HR 100 | Ht 78.0 in | Wt 291.4 lb

## 2022-02-07 DIAGNOSIS — H1032 Unspecified acute conjunctivitis, left eye: Secondary | ICD-10-CM | POA: Diagnosis not present

## 2022-02-07 DIAGNOSIS — Z23 Encounter for immunization: Secondary | ICD-10-CM

## 2022-02-07 MED ORDER — OFLOXACIN 0.3 % OP SOLN: 2.0000 [drp] | Freq: Four times a day (QID) | OPHTHALMIC | 0 refills | Status: AC

## 2022-02-07 NOTE — Progress Notes (Signed)
?  ? ?  Primary Care / Sports Medicine Office Visit ? ?Patient Information:  ?Patient ID: Cody Walsh, male DOB: [birth year] Age: 26 y.o. MRN: [id number]  ? ?[name] is a pleasant 26 y.o. male presenting with the following: ? ?Chief Complaint  ?Patient presents with  ? Eye Problem  ?  Couple days ago started as stye, no waking up with discharge and itching.   ? ? ?Vitals:  ? 02/07/22 1111  ?BP: 128/70  ?Pulse: 100  ?SpO2: 98%  ? ?Vitals:  ? 02/07/22 1111  ?Weight: 291 lb 6.4 oz (132.2 kg)  ?Height: 6\' 6"  (1.981 m)  ? ?Body mass index is 33.67 kg/m?. ? ?No results found.  ? ?Independent interpretation of notes and tests performed by another provider:  ? ?None ? ?Procedures performed:  ? ?None ? ?Pertinent History, Exam, Impression, and Recommendations:  ? ?Need for Tdap vaccination ?Tdap administered today ? ?Acute bacterial conjunctivitis of left eye ?3-4-day history of unilateral left eye progressively worsening erythema, discharge, denies any facial pain, did have transient throat pain.  Examination reveals mildly erythematous turbinates bilaterally, tenderness about the ethmoids bilaterally, right ear canal with erythema, tympanic membranes bilaterally benign, oropharynx benign, no lymphadenopathy, right conjunctive a benign, left conjunctive a injected, mild lid swelling.  Patient history and findings most consistent with conjunctivitis, bacterial etiology of concern, can also consider secondary to adenovirus/alternate viral etiology.  This was reviewed with the patient, will initiate course of ofloxacin.  He can return on as-needed basis.  ? ?Orders & Medications ?Meds ordered this encounter  ?Medications  ? ofloxacin (OCUFLOX) 0.3 % ophthalmic solution  ?  Sig: Place 2 drops into the left eye in the morning, at noon, in the evening, and at bedtime for 5 days.  ?  Dispense:  2 mL  ?  Refill:  0  ? ?Orders Placed This Encounter  ?Procedures  ? Tdap vaccine greater than or equal to 7yo IM  ?  ? ?No  follow-ups on file.  ?  ? ?Montel Culver, MD ? ? Primary Care Sports Medicine ?[facility] ?[place]  ? ?

## 2022-02-07 NOTE — Assessment & Plan Note (Signed)
3-4-day history of unilateral left eye progressively worsening erythema, discharge, denies any facial pain, did have transient throat pain.  Examination reveals mildly erythematous turbinates bilaterally, tenderness about the ethmoids bilaterally, right ear canal with erythema, tympanic membranes bilaterally benign, oropharynx benign, no lymphadenopathy, right conjunctive a benign, left conjunctive a injected, mild lid swelling.  Patient history and findings most consistent with conjunctivitis, bacterial etiology of concern, can also consider secondary to adenovirus/alternate viral etiology.  This was reviewed with the patient, will initiate course of ofloxacin.  He can return on as-needed basis. ?

## 2022-02-07 NOTE — Assessment & Plan Note (Signed)
Tdap administered today. 

## 2022-07-21 ENCOUNTER — Encounter: Payer: Self-pay | Admitting: Family Medicine

## 2022-12-23 ENCOUNTER — Encounter: Payer: Self-pay | Admitting: Family Medicine

## 2022-12-23 ENCOUNTER — Ambulatory Visit (INDEPENDENT_AMBULATORY_CARE_PROVIDER_SITE_OTHER): Payer: Managed Care, Other (non HMO) | Admitting: Family Medicine

## 2022-12-23 VITALS — BP 110/70 | HR 95 | Ht 78.0 in | Wt 299.0 lb

## 2022-12-23 DIAGNOSIS — Z23 Encounter for immunization: Secondary | ICD-10-CM | POA: Diagnosis not present

## 2022-12-23 DIAGNOSIS — Z7689 Persons encountering health services in other specified circumstances: Secondary | ICD-10-CM

## 2022-12-23 DIAGNOSIS — Z0000 Encounter for general adult medical examination without abnormal findings: Secondary | ICD-10-CM | POA: Diagnosis not present

## 2022-12-23 DIAGNOSIS — Z13220 Encounter for screening for lipoid disorders: Secondary | ICD-10-CM

## 2022-12-23 DIAGNOSIS — E559 Vitamin D deficiency, unspecified: Secondary | ICD-10-CM

## 2022-12-23 NOTE — Patient Instructions (Addendum)
-   Obtain fasting labs with orders provided (can have water or black coffee but otherwise no food or drink x 8 hours before labs) - Review information provided - Attend eye doctor annually, dentist every 6 months, work towards or maintain 30 minutes of moderate intensity physical activity at least 5 days per week, and consume a balanced diet - Return in 1 year for physical - Contact us for any questions between now and then  Additionally: - Contact us if any persistent sleep issues noted - Contact us for any weight loss concerns - Review information below regarding sleep hygiene and make changes where appropriate  Sleep hygiene advice  When possible, maximize regularity in activity and sleep schedule   Regularity in the timing of sleep, food intake, and social activity helps to stabilize the biological clock.Minimizing discrepancies in sleep timing between on-shift and off-shift periods may help you adapt to a fixed-shift schedule and may also help you adapt to each shift type in a rotating-shift schedule (depending onrotation speed and direction).   Create a sleep-friendly bedroom environment   Make sure that your bed is comfortable and that your bedroom is dark, quiet, and cool (around 76F or 18C).Blackout shades may be particularly important to block sunlight during daytime sleep. Creating constantbackground noise in the sleep environment with a fan or humidifier, for example, will eliminate unexpectedsounds that would otherwise wake you up.   Limit exposure to bright light before daytime sleep   Exposure to bright light (eg, sunlight during the morning commute home following a night shift) can bealerting and may also set your biological clock to a time that interferes with daytime sleep.   Make the last hour before bed a "wind-down" time   Engage in relaxing and pleasant activities, dim or block light in the room, and have a light snack.   Do not use alcohol to help you sleep and do  not consume alcohol too close to bedtime   Although alcohol may help you to fall asleep more easily, it disrupts your sleep during the night by causingfrequent awakenings. One drink of alcohol should not be consumed within three hours of bedtime.   Smoking and other drugs will disrupt your sleep   If you smoke, do not smoke too close to bedtime or if you wake up during the intended sleep period. Mostdrugs of abuse can disrupt sleep.   Avoid caffeinated products within six hours of bedtime   In addition to coffee, these may include tea, chocolate, and many sodas.   Exercise regularly, but avoid activities that raise body temperature close to bedtime   Regular exercise can improve sleep quality, but exercising or having a warm bath too close to bedtime candisrupt your ability to fall asleep. Warm baths should be avoided within 1.5 hours of bedtime.   Avoid consuming more than 8 to 10 ounces of liquids close to bedtime   A full or semi-full bladder can contribute to awakenings. Restrict liquids close to bedtime and empty yourbladder just before going to bed.

## 2022-12-26 DIAGNOSIS — Z7689 Persons encountering health services in other specified circumstances: Secondary | ICD-10-CM | POA: Insufficient documentation

## 2022-12-26 DIAGNOSIS — Z23 Encounter for immunization: Secondary | ICD-10-CM | POA: Insufficient documentation

## 2022-12-26 NOTE — Assessment & Plan Note (Signed)
Annual examination completed, risk stratification labs ordered, anticipatory guidance provided.  We will follow labs once resulted. 

## 2022-12-26 NOTE — Assessment & Plan Note (Signed)
Discussed sleep hygiene, melatonin, pharmacotherapeutic options that can be prescribed for recalcitrant symptomatology.

## 2022-12-26 NOTE — Progress Notes (Signed)
Annual Physical Exam Visit  Patient Information:  Patient ID: Cody Walsh, male DOB: 29-Aug-1996 Age: 27 y.o. MRN: [id number]   Subjective:   CC: Annual Physical Exam  HPI:  Cody Walsh is here for their annual physical.  I reviewed the past medical history, family history, social history, surgical history, and allergies today and changes were made as necessary.  Please see the problem list section below for additional details.  Past Medical History: Past Medical History:  Diagnosis Date   Allergy    Eczema    Exercise-induced asthma    resolved   Past Surgical History: History reviewed. No pertinent surgical history. Family History: Family History  Problem Relation Age of Onset   Liver disease Father        NASH   Atrial fibrillation Maternal Grandmother    Lupus Maternal Grandmother    Breast cancer Paternal Grandmother    Lupus Paternal Grandmother    Diabetes Paternal Grandfather    Pancreatic cancer Paternal Grandfather    Allergies: Allergies  Allergen Reactions   Other Other (See Comments)    Dust mites   Sulfamethoxazole-Trimethoprim Hives        Nickel Rash   Health Maintenance: Health Maintenance  Topic Date Due   DTaP/Tdap/Td (6 - Td or Tdap) 02/08/2032   INFLUENZA VACCINE  Completed   Hepatitis C Screening  Completed   HIV Screening  Completed   HPV VACCINES  Aged Out   COVID-19 Vaccine  Discontinued    HM Colonoscopy     This patient has no relevant Health Maintenance data.      Medications: Current Outpatient Medications on File Prior to Visit  Medication Sig Dispense Refill   cholecalciferol (VITAMIN D3) 25 MCG (1000 UNIT) tablet Take 1,000 Units by mouth daily. otc     loratadine (CLARITIN) 10 MG tablet Take 10 mg by mouth daily.     No current facility-administered medications on file prior to visit.    Review of Systems: No headache, visual changes, nausea, vomiting, diarrhea, constipation, dizziness, abdominal pain,  skin rash, fevers, chills, night sweats, swollen lymph nodes, weight loss, chest pain, body aches, joint swelling, muscle aches, shortness of breath, mood changes, visual or auditory hallucinations reported.  Objective:   Vitals:   12/23/22 1457  BP: 110/70  Pulse: 95  SpO2: 98%   Vitals:   12/23/22 1457  Weight: 299 lb (135.6 kg)  Height: '6\' 6"'$  (1.981 m)   Body mass index is 34.55 kg/m.  General: Well Developed, well nourished, and in no acute distress.  Neuro: Alert and oriented x3, extra-ocular muscles intact, sensation grossly intact. Cranial nerves II through XII are grossly intact, motor, sensory, and coordinative functions are intact. HEENT: Normocephalic, atraumatic, pupils equal round reactive to light, neck supple, no masses, no lymphadenopathy, thyroid nonpalpable. Oropharynx, nasopharynx, external ear canals are unremarkable. Skin: Warm and dry, no rashes noted.  Cardiac: Regular rate and rhythm, no murmurs rubs or gallops. No peripheral edema. Pulses symmetric. Respiratory: Clear to auscultation bilaterally. Not using accessory muscles, speaking in full sentences.  Abdominal: Soft, nontender, nondistended, positive bowel sounds, no masses, no organomegaly. Musculoskeletal: Shoulder, elbow, wrist, hip, knee, ankle stable, and with full range of motion.   Impression and Recommendations:   The patient was counselled, risk factors were discussed, and anticipatory guidance given.  Problem List Items Addressed This Visit       Other   Sleep concern    Discussed sleep hygiene, melatonin, pharmacotherapeutic options  that can be prescribed for recalcitrant symptomatology.      Need for immunization against influenza   Relevant Orders   Flu Vaccine QUAD 52moIM (Fluarix, Fluzone & Alfiuria Quad PF) (Completed)   Annual physical exam - Primary    Annual examination completed, risk stratification labs ordered, anticipatory guidance provided.  We will follow labs once  resulted.      Relevant Orders   CBC   Comprehensive metabolic panel   Lipid panel   TSH   VITAMIN D 25 Hydroxy (Vit-D Deficiency, Fractures)   Other Visit Diagnoses     Vitamin D deficiency       Relevant Orders   VITAMIN D 25 Hydroxy (Vit-D Deficiency, Fractures)   Screening for lipoid disorders       Relevant Orders   Comprehensive metabolic panel   Lipid panel        Orders & Medications Medications: No orders of the defined types were placed in this encounter.  Orders Placed This Encounter  Procedures   Flu Vaccine QUAD 673moM (Fluarix, Fluzone & Alfiuria Quad PF)   CBC   Comprehensive metabolic panel   Lipid panel   TSH   VITAMIN D 25 Hydroxy (Vit-D Deficiency, Fractures)     Return in about 1 year (around 12/24/2023) for CPE.    JaMontel CulverMD, [facility] Medicine [facility]

## 2023-01-11 LAB — LIPID PANEL
Chol/HDL Ratio: 4.5 ratio (ref 0.0–5.0)
Cholesterol, Total: 166 mg/dL (ref 100–199)
HDL: 37 mg/dL — ABNORMAL LOW (ref >39)
LDL Chol Calc (NIH): 116 mg/dL — ABNORMAL HIGH (ref 0–99)
Triglycerides: 64 mg/dL (ref 0–149)
VLDL Cholesterol Cal: 13 mg/dL (ref 5–40)

## 2023-01-11 LAB — CBC
Hematocrit: 46.3 % (ref 37.5–51.0)
Hemoglobin: 15.5 g/dL (ref 13.0–17.7)
MCH: 28.4 pg (ref 26.6–33.0)
MCHC: 33.5 g/dL (ref 31.5–35.7)
MCV: 85 fL (ref 79–97)
Platelets: 295 10*3/uL (ref 150–450)
RBC: 5.45 x10E6/uL (ref 4.14–5.80)
RDW: 12.2 % (ref 11.6–15.4)
WBC: 7.5 10*3/uL (ref 3.4–10.8)

## 2023-01-11 LAB — COMPREHENSIVE METABOLIC PANEL
ALT: 30 IU/L (ref 0–44)
AST: 17 IU/L (ref 0–40)
Albumin/Globulin Ratio: 2.4 — ABNORMAL HIGH (ref 1.2–2.2)
Albumin: 4.7 g/dL (ref 4.3–5.2)
Alkaline Phosphatase: 70 IU/L (ref 44–121)
BUN/Creatinine Ratio: 18 (ref 9–20)
BUN: 15 mg/dL (ref 6–20)
Bilirubin Total: 0.4 mg/dL (ref 0.0–1.2)
CO2: 20 mmol/L (ref 20–29)
Calcium: 9.6 mg/dL (ref 8.7–10.2)
Chloride: 103 mmol/L (ref 96–106)
Creatinine, Ser: 0.85 mg/dL (ref 0.76–1.27)
Globulin, Total: 2 g/dL (ref 1.5–4.5)
Glucose: 94 mg/dL (ref 70–99)
Potassium: 4.8 mmol/L (ref 3.5–5.2)
Sodium: 140 mmol/L (ref 134–144)
Total Protein: 6.7 g/dL (ref 6.0–8.5)
eGFR: 121 mL/min/{1.73_m2} (ref >59)

## 2023-01-11 LAB — TSH: TSH: 2.08 u[IU]/mL (ref 0.450–4.500)

## 2023-01-11 LAB — VITAMIN D 25 HYDROXY (VIT D DEFICIENCY, FRACTURES): Vit D, 25-Hydroxy: 38.6 ng/mL (ref 30.0–100.0)

## 2023-02-21 IMAGING — CR DG CHEST 2V
2 series · 2 of 2 positions shown · non-contrast
Comparison: None.

CLINICAL DATA: Infection fever for 1 week.

EXAM:
CHEST - 2 VIEW

[chest pa]
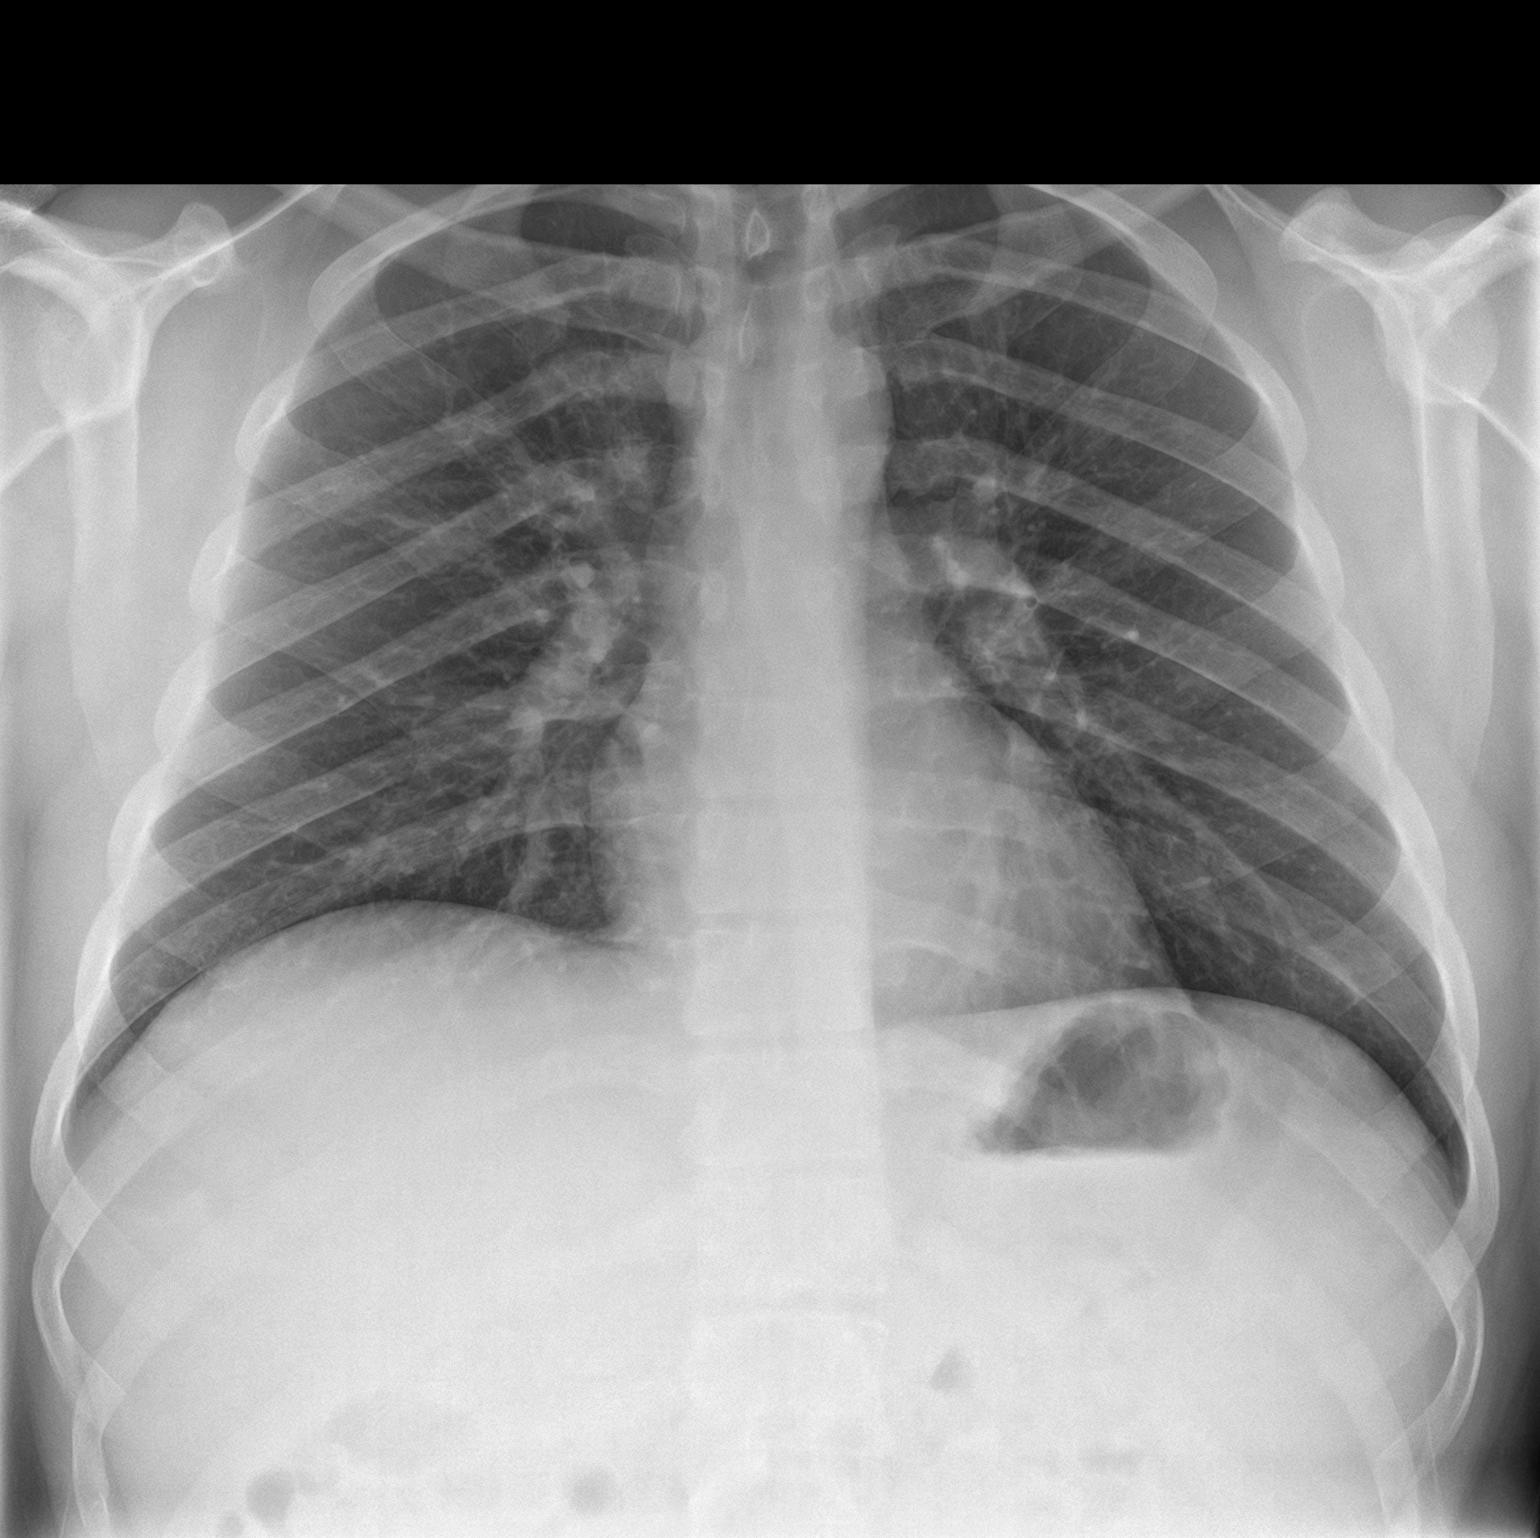

[chest lat]
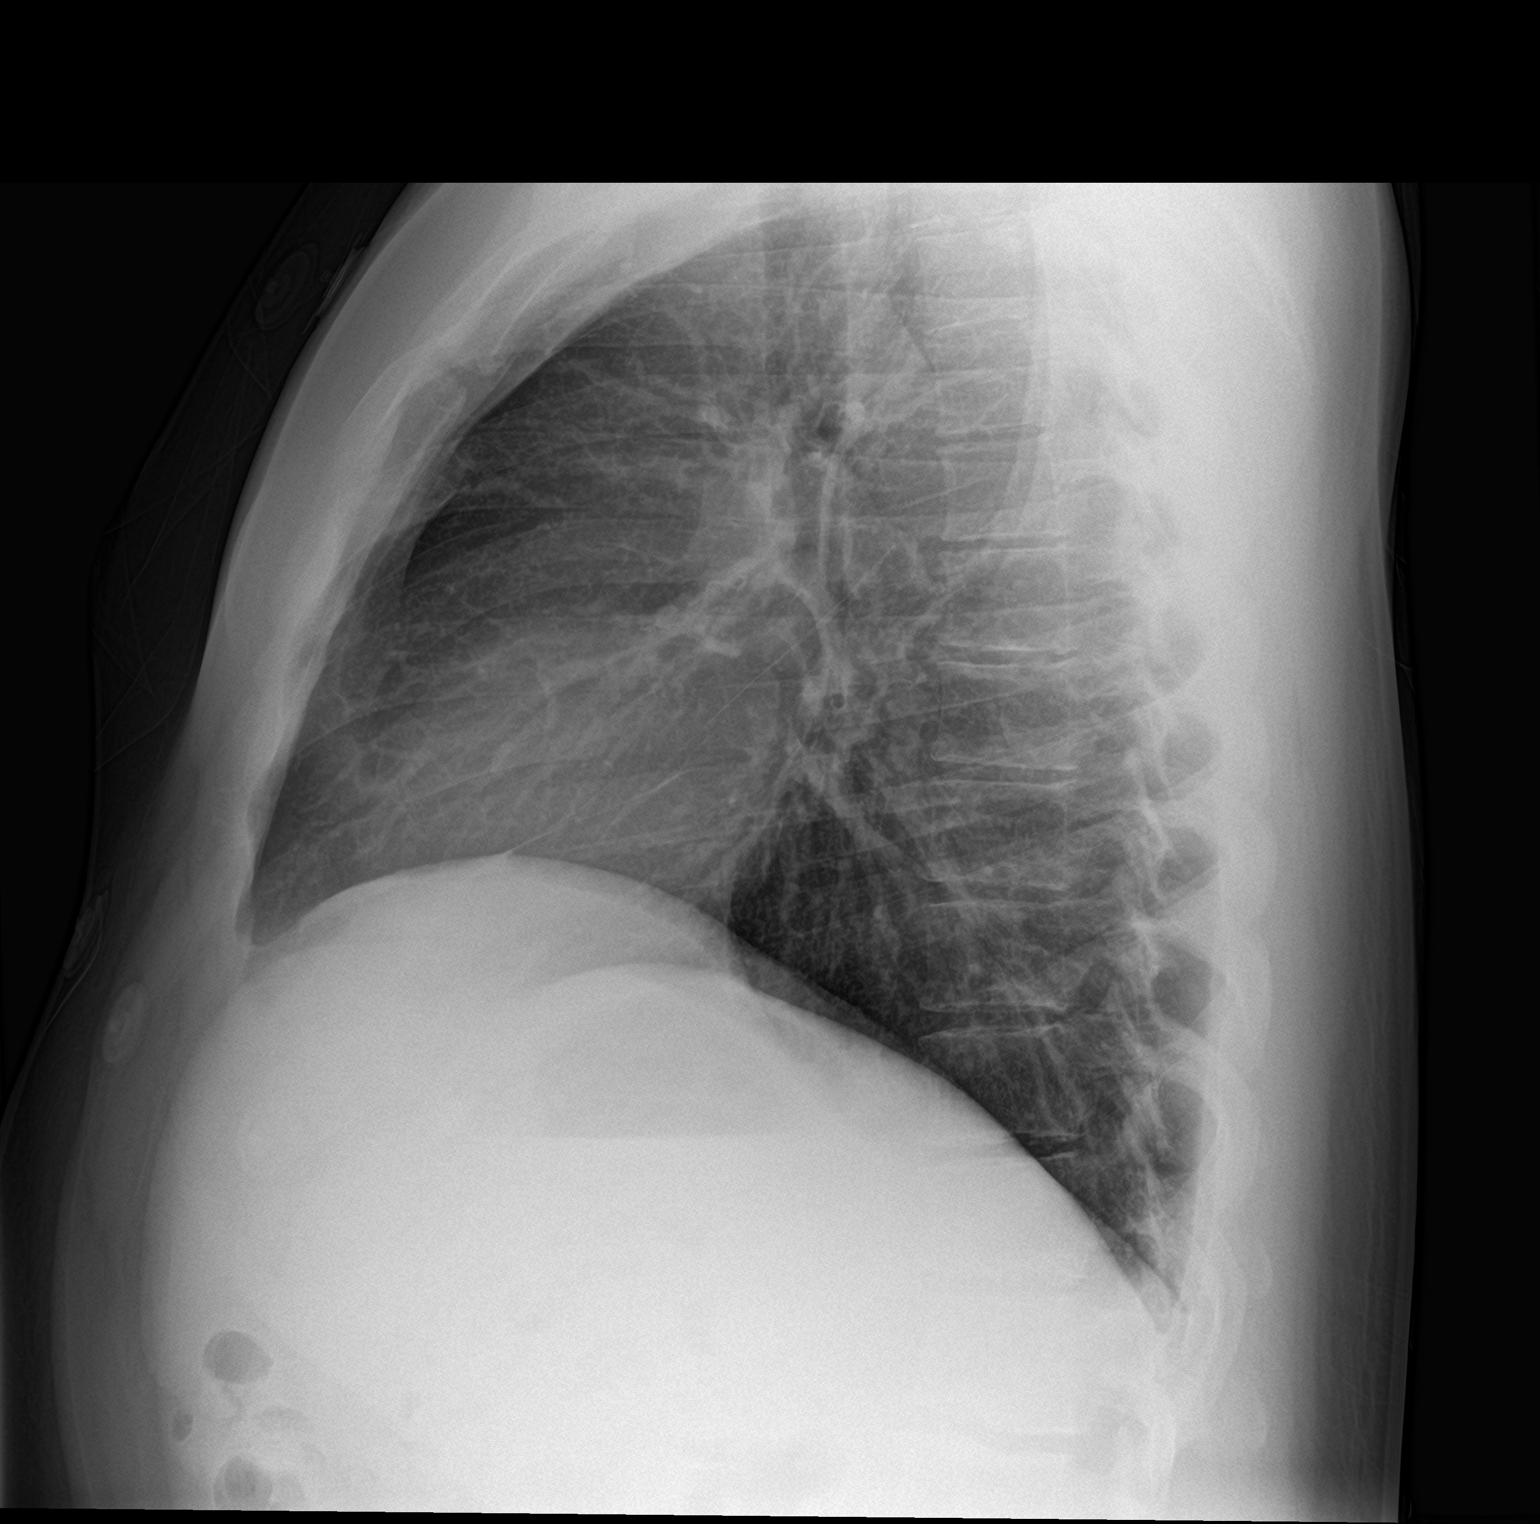

[2 of 2 positions shown; findings below may reference images not displayed]

FINDINGS: The heart size and mediastinal contours are within normal limits.
Both lungs are clear. The visualized skeletal structures are
unremarkable.
IMPRESSION: No active cardiopulmonary disease.

## 2023-10-02 ENCOUNTER — Encounter: Payer: Self-pay | Admitting: Family Medicine

## 2023-10-02 NOTE — Telephone Encounter (Signed)
Please review.  KP

## 2023-10-07 ENCOUNTER — Other Ambulatory Visit: Payer: Self-pay

## 2023-10-07 DIAGNOSIS — G478 Other sleep disorders: Secondary | ICD-10-CM

## 2023-10-07 DIAGNOSIS — R0683 Snoring: Secondary | ICD-10-CM

## 2023-12-26 ENCOUNTER — Ambulatory Visit (INDEPENDENT_AMBULATORY_CARE_PROVIDER_SITE_OTHER): Payer: Self-pay | Admitting: Family Medicine

## 2023-12-26 ENCOUNTER — Encounter: Payer: Self-pay | Admitting: Family Medicine

## 2023-12-26 VITALS — BP 120/82 | HR 79 | Ht 78.0 in | Wt 306.2 lb

## 2023-12-26 DIAGNOSIS — G4733 Obstructive sleep apnea (adult) (pediatric): Secondary | ICD-10-CM

## 2023-12-26 DIAGNOSIS — E785 Hyperlipidemia, unspecified: Secondary | ICD-10-CM | POA: Diagnosis not present

## 2023-12-26 DIAGNOSIS — R7309 Other abnormal glucose: Secondary | ICD-10-CM

## 2023-12-26 DIAGNOSIS — Z0000 Encounter for general adult medical examination without abnormal findings: Secondary | ICD-10-CM

## 2023-12-26 DIAGNOSIS — L309 Dermatitis, unspecified: Secondary | ICD-10-CM

## 2023-12-26 DIAGNOSIS — J310 Chronic rhinitis: Secondary | ICD-10-CM

## 2023-12-27 LAB — LIPID PANEL
Chol/HDL Ratio: 4.8 ratio (ref 0.0–5.0)
Cholesterol, Total: 154 mg/dL (ref 100–199)
HDL: 32 mg/dL — ABNORMAL LOW (ref >39)
LDL Chol Calc (NIH): 98 mg/dL (ref 0–99)
Triglycerides: 133 mg/dL (ref 0–149)
VLDL Cholesterol Cal: 24 mg/dL (ref 5–40)

## 2023-12-27 LAB — CBC
Hematocrit: 45.1 % (ref 37.5–51.0)
Hemoglobin: 15.1 g/dL (ref 13.0–17.7)
MCH: 28.9 pg (ref 26.6–33.0)
MCHC: 33.5 g/dL (ref 31.5–35.7)
MCV: 86 fL (ref 79–97)
Platelets: 349 10*3/uL (ref 150–450)
RBC: 5.23 x10E6/uL (ref 4.14–5.80)
RDW: 12 % (ref 11.6–15.4)
WBC: 8.1 10*3/uL (ref 3.4–10.8)

## 2023-12-27 LAB — COMPREHENSIVE METABOLIC PANEL
ALT: 34 IU/L (ref 0–44)
AST: 23 IU/L (ref 0–40)
Albumin: 4.7 g/dL (ref 4.3–5.2)
Alkaline Phosphatase: 79 IU/L (ref 44–121)
BUN/Creatinine Ratio: 10 (ref 9–20)
BUN: 9 mg/dL (ref 6–20)
Bilirubin Total: 0.4 mg/dL (ref 0.0–1.2)
CO2: 23 mmol/L (ref 20–29)
Calcium: 9.6 mg/dL (ref 8.7–10.2)
Chloride: 105 mmol/L (ref 96–106)
Creatinine, Ser: 0.9 mg/dL (ref 0.76–1.27)
Globulin, Total: 2.4 g/dL (ref 1.5–4.5)
Glucose: 82 mg/dL (ref 70–99)
Potassium: 4.8 mmol/L (ref 3.5–5.2)
Sodium: 143 mmol/L (ref 134–144)
Total Protein: 7.1 g/dL (ref 6.0–8.5)
eGFR: 119 mL/min/{1.73_m2} (ref >59)

## 2023-12-27 LAB — HEMOGLOBIN A1C
Est. average glucose Bld gHb Est-mCnc: 114 mg/dL
Hgb A1c MFr Bld: 5.6 % (ref 4.8–5.6)

## 2024-01-06 ENCOUNTER — Encounter: Payer: Self-pay | Admitting: Family Medicine

## 2024-01-06 DIAGNOSIS — J310 Chronic rhinitis: Secondary | ICD-10-CM | POA: Insufficient documentation

## 2024-01-06 DIAGNOSIS — G4733 Obstructive sleep apnea (adult) (pediatric): Secondary | ICD-10-CM | POA: Insufficient documentation

## 2024-01-06 NOTE — Assessment & Plan Note (Signed)
 He has a history of allergies, including dust mites and mold, which are perennial. He has not seen an allergist in a long time. He has been using loratadine 20 mg daily for over twenty years for his allergies. No recent fevers or chills.

## 2024-01-06 NOTE — Assessment & Plan Note (Signed)
 Eczema No current flare-ups reported. -Advise patient to contact if over-the-counter treatments are not effective during flare-ups.

## 2024-01-06 NOTE — Assessment & Plan Note (Signed)
 Annual examination completed, risk stratification labs ordered, anticipatory guidance provided.  We will follow labs once resulted.  General Health Maintenance -Flu shot and Tdap vaccine are up to date. -Order routine labs.

## 2024-01-06 NOTE — Progress Notes (Signed)
 Annual Physical Exam Visit  Patient Information:  Patient ID: Cody Walsh, male DOB: [birth year] Age: 28 y.o. MRN: [id number]   Subjective:   CC: Annual Physical Exam  HPI:  Cody Walsh is here for their annual physical.  I reviewed the past medical history, family history, social history, surgical history, and allergies today and changes were made as necessary.  Please see the problem list section below for additional details.  Past Medical History: Past Medical History:  Diagnosis Date   Allergy    Eczema    Exercise-induced asthma    resolved   Past Surgical History: History reviewed. No pertinent surgical history. Family History: Family History  Problem Relation Age of Onset   Liver disease Father        NASH   Atrial fibrillation Maternal Grandmother    Lupus Maternal Grandmother    Breast cancer Paternal Grandmother    Lupus Paternal Grandmother    Diabetes Paternal Grandfather    Pancreatic cancer Paternal Grandfather    Allergies: Allergies  Allergen Reactions   Other Other (See Comments)    Dust mites   Sulfamethoxazole-Trimethoprim Hives        Nickel Rash   Health Maintenance: Health Maintenance  Topic Date Due   DTaP/Tdap/Td (6 - Td or Tdap) 02/08/2032   INFLUENZA VACCINE  Completed   Hepatitis C Screening  Completed   HIV Screening  Completed   HPV VACCINES  Aged Out   COVID-19 Vaccine  Discontinued    HM Colonoscopy   This patient has no relevant Health Maintenance data.    Medications: Current Outpatient Medications on File Prior to Visit  Medication Sig Dispense Refill   cholecalciferol (VITAMIN D3) 25 MCG (1000 UNIT) tablet Take 1,000 Units by mouth daily. otc     loratadine (CLARITIN) 10 MG tablet Take 10 mg by mouth daily.     No current facility-administered medications on file prior to visit.    Objective:   Vitals:   12/26/23 1509  BP: 120/82  Pulse: 79  SpO2: 98%   Vitals:   12/26/23 1509  Weight: (!) 306  lb 3.2 oz (138.9 kg)  Height: 6\' 6"  (1.981 m)   Body mass index is 35.38 kg/m.  General: Well Developed, well nourished, and in no acute distress.  Neuro: Alert and oriented x3, extra-ocular muscles intact, sensation grossly intact. Cranial nerves II through XII are grossly intact, motor, sensory, and coordinative functions are intact. HEENT: Normocephalic, atraumatic, neck supple, no masses, no lymphadenopathy, thyroid nonenlarged. Oropharynx, nasopharynx with bilateral turbinate swelling, external ear canals are unremarkable. Skin: Warm and dry, no rashes noted.  Cardiac: Regular rate and rhythm, no murmurs rubs or gallops. No peripheral edema. Pulses symmetric. Respiratory: Clear to auscultation bilaterally. Speaking in full sentences.  Abdominal: Soft, nontender, nondistended, positive bowel sounds, no masses, no organomegaly. Musculoskeletal: Stable, and with full range of motion.  Impression and Recommendations:   The patient was counselled, risk factors were discussed, and anticipatory guidance given.  Problem List Items Addressed This Visit     Chronic rhinitis   He has a history of allergies, including dust mites and mold, which are perennial. He has not seen an allergist in a long time. He has been using loratadine 20 mg daily for over twenty years for his allergies. No recent fevers or chills.      Relevant Orders   Ambulatory referral to ENT   Eczema   Eczema No current flare-ups reported. -Advise patient to  contact if over-the-counter treatments are not effective during flare-ups.      Healthcare maintenance - Primary   Annual examination completed, risk stratification labs ordered, anticipatory guidance provided.  We will follow labs once resulted.  General Health Maintenance -Flu shot and Tdap vaccine are up to date. -Order routine labs.      Relevant Orders   CBC (Completed)   OSA on CPAP   He has been experiencing difficulty using his CPAP machine due to  sinus congestion, which makes it hard to breathe, especially when exhaling against the CPAP pressure. He uses nasal pillow with his CPAP, and the congestion has led to him either removing the device or waking up due to insomnia.  Obstructive Sleep Apnea Patient is using CPAP machine but experiencing difficulty due to sinus congestion. Nasal congestion has been ongoing for approximately 1.5 weeks following a cold. Patient has been using Mucinex nasal spray for relief. -Refer to ENT for further evaluation and management of sinus congestion and potential impact on sleep apnea treatment. -Recommend use of intranasal steroid (Flonase, Nasacort, Rhinocort) to reduce nasal inflammation. -Advise patient to switch from loratadine to another antihistamine (e.g., cetirizine) due to potential acclimation to loratadine. -Advise continued use of Mucinex to thin mucus. -Advise patient to monitor for worsening symptoms or development of fever, which may indicate a sinus infection.      Relevant Orders   Ambulatory referral to ENT   Other Visit Diagnoses       Hyperlipidemia, unspecified hyperlipidemia type       Relevant Orders   Lipid panel (Completed)     Abnormal glucose       Relevant Orders   Comprehensive metabolic panel (Completed)   Hemoglobin A1c (Completed)        Orders & Medications Medications: No orders of the defined types were placed in this encounter.  Orders Placed This Encounter  Procedures   CBC   Comprehensive metabolic panel   Hemoglobin A1c   Lipid panel   Ambulatory referral to ENT     Return in about 1 year (around 12/25/2024) for CPE.    Jerrol Banana, MD, [facility]   Primary Care Sports Medicine Primary Care and Sports Medicine at [facility]

## 2024-01-06 NOTE — Patient Instructions (Signed)
-   Obtain fasting labs with orders provided (can have water or black coffee but otherwise no food or drink x 8 hours before labs) - Review information provided - Attend eye doctor annually, dentist every 6 months, work towards or maintain 30 minutes of moderate intensity physical activity at least 5 days per week, and consume a balanced diet - Return in 1 year for physical - Contact us for any questions between now and then    Patient Care Plan  Chronic Rhinitis and Sleep Apnea Management 1. Switch from loratadine to cetirizine for allergy management. 2. Continue using Mucinex to help thin mucus. 3. Start using an intranasal steroid (Flonase, Nasacort, Rhinocort) to reduce nasal inflammation. 4. Follow up with ENT for evaluation and management of sinus congestion affecting your CPAP use.  Eczema Management 1. Contact your healthcare provider if over-the-counter treatments do not help during flare-ups.  General Health Maintenance 1. Routine labs have been ordered; follow up once results are available.  Red Flags to Watch For - Monitor for worsening symptoms or development of a fever, which may indicate a sinus infection. Contact your healthcare provider if these occur.

## 2024-01-06 NOTE — Assessment & Plan Note (Signed)
 He has been experiencing difficulty using his CPAP machine due to sinus congestion, which makes it hard to breathe, especially when exhaling against the CPAP pressure. He uses nasal pillow with his CPAP, and the congestion has led to him either removing the device or waking up due to insomnia.  Obstructive Sleep Apnea Patient is using CPAP machine but experiencing difficulty due to sinus congestion. Nasal congestion has been ongoing for approximately 1.5 weeks following a cold. Patient has been using Mucinex nasal spray for relief. -Refer to ENT for further evaluation and management of sinus congestion and potential impact on sleep apnea treatment. -Recommend use of intranasal steroid (Flonase, Nasacort, Rhinocort) to reduce nasal inflammation. -Advise patient to switch from loratadine to another antihistamine (e.g., cetirizine) due to potential acclimation to loratadine. -Advise continued use of Mucinex to thin mucus. -Advise patient to monitor for worsening symptoms or development of fever, which may indicate a sinus infection.

## 2024-06-21 ENCOUNTER — Other Ambulatory Visit: Payer: Self-pay | Admitting: Otolaryngology

## 2024-06-30 ENCOUNTER — Encounter: Payer: Self-pay | Admitting: Otolaryngology

## 2024-07-02 NOTE — Discharge Instructions (Signed)

## 2024-07-07 NOTE — Anesthesia Preprocedure Evaluation (Signed)
 Anesthesia Evaluation  Patient identified by MRN, date of birth, ID band Patient awake    Reviewed: Allergy & Precautions, H&P , NPO status , Patient's Chart, lab work & pertinent test results  Airway Mallampati: II  TM Distance: >3 FB Neck ROM: Full    Dental no notable dental hx. (+) Caps, Chipped Two upper central incisors are capped. The left one is intact, but the right upper central capped incisor is chipped. On exam, all the way across the tooth is a light line,  and there is a small chip on lateral edge of tooth. :   Pulmonary asthma , sleep apnea , former smoker   Pulmonary exam normal breath sounds clear to auscultation       Cardiovascular negative cardio ROS Normal cardiovascular exam Rhythm:Regular Rate:Normal     Neuro/Psych  Neuromuscular disease negative neurological ROS  negative psych ROS   GI/Hepatic negative GI ROS, Neg liver ROS,,,  Endo/Other  negative endocrine ROS    Renal/GU negative Renal ROS  negative genitourinary   Musculoskeletal negative musculoskeletal ROS (+)    Abdominal   Peds negative pediatric ROS (+)  Hematology negative hematology ROS (+)   Anesthesia Other Findings Eczema Exercise-induced asthma Allergies  This patient has never had any anesthesia. Patient's father accompanied him today and I spoke with his father.  His father is allergic to succinylcholine.  Father had emergency gallbladder surgery  and was told it took several hours to wake up and this was attributed to succinylcholine and the fact that he has sleep apnea.  This was in the early 1990s. The father has never heard the words pseudocholinesterase deficiency nor malignant hyperthermia.  Father denies having had any high fevers or muscles aches or pains during the postop time.  Since that time, father had umbilical hernia repair, and told staff he could not take succinylcholine. Patient's mother did not  accompany him, but patient states his mother has had several surgeries and she was fine.  However, due to lack of absolute knowledge about possible pseudocholinesterase deficiency, will avoid succinylcholine today.   Reproductive/Obstetrics negative OB ROS                              Anesthesia Physical Anesthesia Plan  ASA: 2  Anesthesia Plan: General ETT   Post-op Pain Management:    Induction: Intravenous  PONV Risk Score and Plan:   Airway Management Planned: Oral ETT  Additional Equipment:   Intra-op Plan:   Post-operative Plan: Extubation in OR  Informed Consent: I have reviewed the patients History and Physical, chart, labs and discussed the procedure including the risks, benefits and alternatives for the proposed anesthesia with the patient or authorized representative who has indicated his/her understanding and acceptance.     Dental Advisory Given  Plan Discussed with: Anesthesiologist, CRNA and Surgeon  Anesthesia Plan Comments: (Patient consented for risks of anesthesia including but not limited to:  - adverse reactions to medications - damage to eyes, teeth, lips or other oral mucosa - nerve damage due to positioning  - sore throat or hoarseness - Damage to heart, brain, nerves, lungs, other parts of body or loss of life  Patient voiced understanding and assent.)         Anesthesia Quick Evaluation

## 2024-07-08 ENCOUNTER — Other Ambulatory Visit: Payer: Self-pay

## 2024-07-08 ENCOUNTER — Ambulatory Visit: Payer: Self-pay | Admitting: Anesthesiology

## 2024-07-08 ENCOUNTER — Encounter: Payer: Self-pay | Admitting: Otolaryngology

## 2024-07-08 ENCOUNTER — Ambulatory Visit
Admission: RE | Admit: 2024-07-08 | Discharge: 2024-07-08 | Disposition: A | Discharge status: Home / Self Care | Attending: Otolaryngology | Admitting: Otolaryngology

## 2024-07-08 ENCOUNTER — Encounter
Admission: RE | Disposition: A | Discharge status: Home / Self Care | Payer: Self-pay | Source: Home / Self Care | Attending: Otolaryngology

## 2024-07-08 DIAGNOSIS — J343 Hypertrophy of nasal turbinates: Secondary | ICD-10-CM | POA: Diagnosis present

## 2024-07-08 DIAGNOSIS — G4730 Sleep apnea, unspecified: Secondary | ICD-10-CM | POA: Insufficient documentation

## 2024-07-08 DIAGNOSIS — J3489 Other specified disorders of nose and nasal sinuses: Secondary | ICD-10-CM | POA: Diagnosis present

## 2024-07-08 DIAGNOSIS — Z87891 Personal history of nicotine dependence: Secondary | ICD-10-CM | POA: Insufficient documentation

## 2024-07-08 DIAGNOSIS — J320 Chronic maxillary sinusitis: Secondary | ICD-10-CM | POA: Diagnosis present

## 2024-07-08 HISTORY — PX: TURBINATE REDUCTION: SHX6157

## 2024-07-08 HISTORY — PX: IMAGE GUIDED SINUS SURGERY: SHX6570

## 2024-07-08 HISTORY — PX: MAXILLARY ANTROSTOMY: SHX2003

## 2024-07-08 SURGERY — SINUS SURGERY, WITH IMAGING GUIDANCE
Anesthesia: General | Laterality: Bilateral

## 2024-07-08 MED ORDER — PREDNISONE 10 MG PO TABS: ORAL_TABLET | ORAL | 0 refills | Status: AC

## 2024-07-08 MED ORDER — OXYCODONE HCL 5 MG PO TABS
10.0000 mg | ORAL_TABLET | Freq: Once | ORAL | Status: AC
  Administered 2024-07-08: 10 mg via ORAL

## 2024-07-08 MED ORDER — SUGAMMADEX SODIUM 200 MG/2ML IV SOLN
INTRAVENOUS | Status: AC
Start: 2024-07-08 — End: 2024-07-08
  Filled 2024-07-08: qty 2

## 2024-07-08 MED ORDER — HYDROCODONE-ACETAMINOPHEN 5-325 MG PO TABS: 1.0000 | ORAL_TABLET | Freq: Four times a day (QID) | ORAL | 0 refills | Status: AC | PRN

## 2024-07-08 MED ORDER — CEFAZOLIN SODIUM-DEXTROSE 2-3 GM-%(50ML) IV SOLR
INTRAVENOUS | Status: AC
  Filled 2024-07-08: qty 50

## 2024-07-08 MED ORDER — MIDAZOLAM HCL 2 MG/2ML IJ SOLN
INTRAMUSCULAR | Status: AC
  Filled 2024-07-08: qty 2

## 2024-07-08 MED ORDER — LACTATED RINGERS IV SOLN: INTRAVENOUS | Status: DC

## 2024-07-08 MED ORDER — ROCURONIUM BROMIDE 100 MG/10ML IV SOLN
INTRAVENOUS | Status: DC | PRN
Start: 2024-07-08 — End: 2024-07-08
  Administered 2024-07-08: 40 mg via INTRAVENOUS

## 2024-07-08 MED ORDER — OXYCODONE HCL 5 MG PO TABS
ORAL_TABLET | ORAL | Status: AC
  Filled 2024-07-08: qty 2

## 2024-07-08 MED ORDER — SUGAMMADEX SODIUM 200 MG/2ML IV SOLN
INTRAVENOUS | Status: DC | PRN
  Administered 2024-07-08: 270 mg via INTRAVENOUS

## 2024-07-08 MED ORDER — PROPOFOL 1000 MG/100ML IV EMUL
INTRAVENOUS | Status: AC
  Filled 2024-07-08: qty 100

## 2024-07-08 MED ORDER — PHENYLEPHRINE HCL 0.5 % NA SOLN
NASAL | Status: DC | PRN
  Administered 2024-07-08: 15 mL via TOPICAL

## 2024-07-08 MED ORDER — MIDAZOLAM HCL 5 MG/5ML IJ SOLN
INTRAMUSCULAR | Status: DC | PRN
  Administered 2024-07-08: 2 mg via INTRAVENOUS

## 2024-07-08 MED ORDER — FENTANYL CITRATE (PF) 100 MCG/2ML IJ SOLN
INTRAMUSCULAR | Status: DC | PRN
  Administered 2024-07-08: 100 ug via INTRAVENOUS

## 2024-07-08 MED ORDER — FENTANYL CITRATE (PF) 100 MCG/2ML IJ SOLN
INTRAMUSCULAR | Status: AC
  Filled 2024-07-08: qty 2

## 2024-07-08 MED ORDER — SODIUM CHLORIDE 0.9 % IV SOLN: INTRAVENOUS | Status: DC

## 2024-07-08 MED ORDER — PROPOFOL 10 MG/ML IV BOLUS
INTRAVENOUS | Status: DC | PRN
Start: 2024-07-08 — End: 2024-07-08
  Administered 2024-07-08: 200 mg via INTRAVENOUS

## 2024-07-08 MED ORDER — HEMOSTATIC AGENTS (NO CHARGE) OPTIME
TOPICAL | Status: DC | PRN
  Administered 2024-07-08: 1 via TOPICAL

## 2024-07-08 MED ORDER — FENTANYL CITRATE PF 50 MCG/ML IJ SOSY: 50.0000 ug | PREFILLED_SYRINGE | INTRAMUSCULAR | Status: DC | PRN

## 2024-07-08 MED ORDER — LIDOCAINE HCL (CARDIAC) PF 100 MG/5ML IV SOSY
PREFILLED_SYRINGE | INTRAVENOUS | Status: DC | PRN
  Administered 2024-07-08: 100 mg via INTRAVENOUS

## 2024-07-08 MED ORDER — OXYMETAZOLINE HCL 0.05 % NA SOLN
NASAL | Status: AC
  Filled 2024-07-08: qty 30

## 2024-07-08 MED ORDER — GLYCOPYRROLATE 0.2 MG/ML IJ SOLN
INTRAMUSCULAR | Status: DC | PRN
  Administered 2024-07-08: .2 mg via INTRAVENOUS

## 2024-07-08 MED ORDER — LIDOCAINE-EPINEPHRINE 1 %-1:100000 IJ SOLN
INTRAMUSCULAR | Status: DC | PRN
  Administered 2024-07-08: .5 mL
  Administered 2024-07-08: 1.5 mL

## 2024-07-08 MED ORDER — CEPHALEXIN 500 MG PO CAPS: 500.0000 mg | ORAL_CAPSULE | Freq: Three times a day (TID) | ORAL | 0 refills | Status: AC

## 2024-07-08 MED ORDER — ACETAMINOPHEN 10 MG/ML IV SOLN
INTRAVENOUS | Status: AC
  Filled 2024-07-08: qty 100

## 2024-07-08 MED ORDER — DEXTROSE 5 % IV SOLN
2.0000 g | Freq: Once | INTRAVENOUS | Status: AC
  Administered 2024-07-08: 2 g via INTRAVENOUS

## 2024-07-08 MED ORDER — DEXAMETHASONE SODIUM PHOSPHATE 4 MG/ML IJ SOLN
INTRAMUSCULAR | Status: DC | PRN
  Administered 2024-07-08: 10 mg via INTRAVENOUS

## 2024-07-08 MED ORDER — ACETAMINOPHEN 10 MG/ML IV SOLN
1000.0000 mg | Freq: Once | INTRAVENOUS | Status: AC
  Administered 2024-07-08: 1000 mg via INTRAVENOUS

## 2024-07-08 MED ORDER — ONDANSETRON HCL 4 MG/2ML IJ SOLN
INTRAMUSCULAR | Status: DC | PRN
  Administered 2024-07-08: 4 mg via INTRAVENOUS

## 2024-07-08 MED ORDER — OXYMETAZOLINE HCL 0.05 % NA SOLN: 2.0000 | Freq: Once | NASAL | Status: DC

## 2024-07-08 SURGICAL SUPPLY — 24 items
BLADE SHAVER TRUDI 4 60 DEG (ENT DISPOSABLE) IMPLANT
BLADE SHAVER TRUDI STR 4 (ENT DISPOSABLE) ×1 IMPLANT
CABLE TRUDI DISPOSABLE (ENT DISPOSABLE) ×2 IMPLANT
CANISTER SUCT 1200ML W/VALVE (MISCELLANEOUS) ×1 IMPLANT
CATH IV 18X1 1/4 SAFELET (CATHETERS) ×1 IMPLANT
COAGULATOR SUCT 8FR VV (MISCELLANEOUS) ×1 IMPLANT
ELECTRODE REM PT RTRN 9FT ADLT (ELECTROSURGICAL) ×1 IMPLANT
GLOVE SURG GAMMEX PI TX LF 7.5 (GLOVE) ×2 IMPLANT
GOWN STRL REUS W/ TWL LRG LVL3 (GOWN DISPOSABLE) ×1 IMPLANT
IV NS 500ML BAXH (IV SOLUTION) ×1 IMPLANT
KIT TURNOVER KIT A (KITS) ×1 IMPLANT
NDL ANESTHESIA 27G X 3.5 (NEEDLE) ×1 IMPLANT
NEEDLE ANESTHESIA 27G X 3.5 (NEEDLE) ×1 IMPLANT
NS IRRIG 500ML POUR BTL (IV SOLUTION) ×1 IMPLANT
PACK ENT CUSTOM (PACKS) ×1 IMPLANT
PACKING NASAL EPIS 4X2.4 XEROG (MISCELLANEOUS) ×2 IMPLANT
PATTIES SURGICAL .5 X3 (DISPOSABLE) ×1 IMPLANT
SOLUTION ANTFG W/FOAM PAD STRL (MISCELLANEOUS) ×1 IMPLANT
STRAP BODY AND KNEE 60X3 (MISCELLANEOUS) ×1 IMPLANT
SYR 3ML LL SCALE MARK (SYRINGE) ×1 IMPLANT
TOWEL OR 17X26 4PK STRL BLUE (TOWEL DISPOSABLE) ×1 IMPLANT
TRACKER DISPOSABLE PAITIENT (MISCELLANEOUS) ×1 IMPLANT
TUBING IRRIGATION BIEN-AIR (TUBING) ×1 IMPLANT
WATER STERILE IRR 250ML POUR (IV SOLUTION) ×1 IMPLANT

## 2024-07-08 NOTE — Anesthesia Procedure Notes (Signed)
 Procedure Name: Intubation Date/Time: 07/08/2024 10:32 AM  Performed by: Levy Harvey, CRNAPre-anesthesia Checklist: Patient identified, Emergency Drugs available, Suction available, Patient being monitored and Timeout performed Patient Re-evaluated:Patient Re-evaluated prior to induction Oxygen Delivery Method: Circle system utilized Preoxygenation: Pre-oxygenation with 100% oxygen Induction Type: IV induction Ventilation: Mask ventilation without difficulty Laryngoscope Size: Mac and 4 Grade View: Grade II Tube type: Oral Rae Tube size: 7.5 mm Number of attempts: 1 Placement Confirmation: ETT inserted through vocal cords under direct vision, positive ETCO2 and breath sounds checked- equal and bilateral Tube secured with: Tape Dental Injury: Teeth and Oropharynx as per pre-operative assessment

## 2024-07-08 NOTE — Op Note (Signed)
 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  Accessory ostia bilaterally.  Large inferior turbinates.  Septum relatively straight.  Procedure: Patient brought to the operating room placed in the spine position.  He was given general anesthesia by oral intubation.  Once patient was asleep his nose was prepped using cottonoid pledgets soaked in 4% Xylocaine  mixed with Afrin.  The image guided system was brought in and his face was registered to the system.  The instruments were registered as well.  There was good alignment between the instruments and the image guided system.  He was prepped and draped in sterile fashion.  The nose was visualized with the 0 and 30 degree scopes on both sides.  The right side was addressed first.  Local anesthesia using 1% Xylocaine  with epi 1: 100,000 was used for infiltration the uncinate process and the anterior and posterior roots of the middle turbinate.  The middle turbinate was infractured to visualize the middle meatus better.  There was a small accessory ostia and a very large uncinate process.  The uncinate process was incised with a side biter and then the uncinate process was removed using a 45 degree through biting forceps.  The microdebrider was used for trimming the edges inferiorly and posteriorly along the opening to the maxillary antrum.  The large Haller cell was visible and most of the inferior border of the Clarendon cell was removed to create a larger opening into the maxillary sinus.  30 degree scope was used to visualize in the sinus and there is no  significant disease further in.  Antrum was now wide open and clear.  Cottonoid pledgets were placed here temporarily while the left side was then addressed.  The left middle turbinate was infractured to visualize the middle meatus better.  There was a accessory ostium along with the large uncinate process.  The uncinate process was incised with side biter and then was removed using 45 degree up-biting forceps.  The maxillary antrum was then widened using through biting forceps and the microdebrider.  The wall of the sinus was fairly lateral and hard to get to so a curved microdebrider was used for opened up the anterior and inferior walls of the maxillary sinus.  This opened directly into the accessory ostium as well so there would not be any recycling.  The maxillary antrum was now wide open and depth the sinus was clear when visualizing with 30 degree scope.  Cottonoid pledgets were placed or temporarily while the right side was revisited.  The 0 degree scope was used to visualize the right side after the cotton pledgets were removed.  The area at the maxillary antrum was clear.  The inferior turbinate was cauterized along its medial border and was trimmed to slightly along its lateral inferior border.  The remaining turbinate was outfractured to create more room on the right side of the nose.  The cottonoid pledgets were removed on the left side and this was visualized with a 0 degree scope again.  The inferior turbinate was trimmed slightly and electrocautery was used along its medial border help shrink the  tissues.  The remaining turbinate was outfractured to create more room in the airway.  Both sides were then visualized using 0 and 3 degree scopes.  The maxillary antrums were open.  Xerogel was placed in the middle meatus on both sides to help prevent scarring and scabbing in this area.  Patient was awakened and taken to recovery room in satisfactory condition.  There were no operative  complications.   Dispo:   To PACU to be discharged home  Plan: To follow-up in the office in 6 days to make sure the nose is open and clear.  He will be using saline flushes at home to clean things out.  The head of his bed will be elevated for the next 4 days and will rest at home for those 4 days.  Thing in become more active next week.  He can use his CPAP whenever his nose is open enough to get airflow through there.  I have given him antibiotics for 1 week for prevention.  He has a prednisone  taper to help decrease swelling.  He is given some Tylenol  with codeine to use for severe pain but he will switch to Tylenol  whenever there is no significant pain.  [facility] DEL Cody Walsh  07/08/2024 12:10 PM

## 2024-07-08 NOTE — Anesthesia Postprocedure Evaluation (Signed)
 Anesthesia Post Note  Patient: Cody Walsh  Procedure(s) Performed: SINUS SURGERY, WITH IMAGING GUIDANCE (Bilateral) MAXILLARY ANTROSTOMY (Bilateral) REDUCTION, NASAL TURBINATE (Bilateral)  Patient location during evaluation: PACU Anesthesia Type: General Level of consciousness: awake and alert Pain management: pain level controlled Vital Signs Assessment: post-procedure vital signs reviewed and stable Respiratory status: spontaneous breathing, nonlabored ventilation, respiratory function stable and patient connected to nasal cannula oxygen Cardiovascular status: blood pressure returned to baseline and stable Postop Assessment: no apparent nausea or vomiting Anesthetic complications: no   No notable events documented.   Last Vitals:  Vitals:   07/08/24 1300 07/08/24 1315  BP: (!) 129/90 133/87  Pulse: 64 71  Resp: 17 (!) 26  Temp: (!) 36.3 C   SpO2: 99% 98%    Last Pain:  Vitals:   07/08/24 1315  TempSrc:   PainSc: 3                  Reianna Batdorf C Ankush Gintz

## 2024-07-08 NOTE — Transfer of Care (Signed)
 Immediate Anesthesia Transfer of Care Note  Patient: Drayce Tawil  Procedure(s) Performed: SINUS SURGERY, WITH IMAGING GUIDANCE (Bilateral) MAXILLARY ANTROSTOMY (Bilateral) REDUCTION, NASAL TURBINATE (Bilateral)  Patient Location: PACU  Anesthesia Type: General ETT  Level of Consciousness: awake, alert  and patient cooperative  Airway and Oxygen Therapy: Patient Spontanous Breathing and Patient connected to supplemental oxygen  Post-op Assessment: Post-op Vital signs reviewed, Patient's Cardiovascular Status Stable, Respiratory Function Stable, Patent Airway and No signs of Nausea or vomiting  Post-op Vital Signs: Reviewed and stable  Complications: No notable events documented.

## 2024-07-12 LAB — SURGICAL PATHOLOGY

## 2024-12-28 ENCOUNTER — Encounter: Payer: Self-pay | Admitting: Family Medicine
